# Patient Record
Sex: Female | Born: 1950 | Race: White | Hispanic: No | Marital: Married | Smoking: Never smoker
Health system: Southern US, Community
[De-identification: ages and names within clinical notes are randomized; demographics above are authoritative.]

## PROBLEM LIST (undated history)

## (undated) DIAGNOSIS — E039 Hypothyroidism, unspecified: Secondary | ICD-10-CM

## (undated) DIAGNOSIS — K743 Primary biliary cirrhosis: Secondary | ICD-10-CM

## (undated) DIAGNOSIS — R5383 Other fatigue: Secondary | ICD-10-CM

## (undated) DIAGNOSIS — R161 Splenomegaly, not elsewhere classified: Secondary | ICD-10-CM

## (undated) DIAGNOSIS — K7460 Unspecified cirrhosis of liver: Secondary | ICD-10-CM

## (undated) DIAGNOSIS — R188 Other ascites: Secondary | ICD-10-CM

## (undated) DIAGNOSIS — I10 Essential (primary) hypertension: Secondary | ICD-10-CM

## (undated) DIAGNOSIS — K219 Gastro-esophageal reflux disease without esophagitis: Secondary | ICD-10-CM

## (undated) HISTORY — PX: HERNIA REPAIR: SHX51

## (undated) HISTORY — PX: ABDOMINAL HYSTERECTOMY: SHX81

---

## 2005-06-04 ENCOUNTER — Ambulatory Visit: Payer: Self-pay | Admitting: Nurse Practitioner

## 2007-04-06 ENCOUNTER — Ambulatory Visit: Payer: Self-pay | Admitting: Family Medicine

## 2007-06-21 ENCOUNTER — Encounter: Payer: Self-pay | Admitting: Unknown Physician Specialty

## 2007-07-20 ENCOUNTER — Encounter: Payer: Self-pay | Admitting: Unknown Physician Specialty

## 2007-10-10 ENCOUNTER — Ambulatory Visit: Payer: Self-pay | Admitting: Family Medicine

## 2007-12-09 ENCOUNTER — Ambulatory Visit: Payer: Self-pay | Admitting: *Deleted

## 2008-01-17 ENCOUNTER — Ambulatory Visit: Payer: Self-pay | Admitting: Family Medicine

## 2008-01-23 ENCOUNTER — Encounter: Payer: Self-pay | Admitting: Family Medicine

## 2008-02-17 ENCOUNTER — Encounter: Payer: Self-pay | Admitting: Family Medicine

## 2008-03-19 ENCOUNTER — Encounter: Payer: Self-pay | Admitting: Family Medicine

## 2008-03-19 ENCOUNTER — Ambulatory Visit: Payer: Self-pay | Admitting: Internal Medicine

## 2008-03-30 ENCOUNTER — Ambulatory Visit: Payer: Self-pay | Admitting: Internal Medicine

## 2008-04-18 ENCOUNTER — Ambulatory Visit: Payer: Self-pay | Admitting: Internal Medicine

## 2008-11-08 ENCOUNTER — Ambulatory Visit: Payer: Self-pay

## 2009-11-14 ENCOUNTER — Ambulatory Visit: Payer: Self-pay

## 2011-10-31 ENCOUNTER — Ambulatory Visit: Payer: Self-pay

## 2011-11-27 ENCOUNTER — Emergency Department: Payer: Self-pay | Admitting: Internal Medicine

## 2011-11-27 LAB — CBC
HGB: 13 g/dL (ref 12.0–16.0)
MCH: 30.3 pg (ref 26.0–34.0)
MCV: 89 fL (ref 80–100)
Platelet: 231 10*3/uL (ref 150–440)
RBC: 4.29 10*6/uL (ref 3.80–5.20)
RDW: 12.3 % (ref 11.5–14.5)

## 2011-11-27 LAB — BASIC METABOLIC PANEL
Anion Gap: 7 (ref 7–16)
BUN: 17 mg/dL (ref 7–18)
Co2: 31 mmol/L (ref 21–32)
EGFR (African American): >60
EGFR (Non-African Amer.): >60
Glucose: 92 mg/dL (ref 65–99)
Osmolality: 281 (ref 275–301)
Potassium: 3.9 mmol/L (ref 3.5–5.1)
Sodium: 140 mmol/L (ref 136–145)

## 2011-11-27 LAB — HEPATIC FUNCTION PANEL A (ARMC)
Alkaline Phosphatase: 106 U/L (ref 50–136)
Bilirubin, Direct: 0.2 mg/dL (ref 0.00–0.20)
Bilirubin,Total: 0.6 mg/dL (ref 0.2–1.0)
SGOT(AST): 34 U/L (ref 15–37)
SGPT (ALT): 28 U/L

## 2011-11-27 LAB — CK TOTAL AND CKMB (NOT AT ARMC)
CK, Total: 29 U/L (ref 21–215)
CK-MB: <0.5 ng/mL — ABNORMAL LOW (ref 0.5–3.6)

## 2011-12-16 ENCOUNTER — Ambulatory Visit: Payer: Self-pay | Admitting: Gastroenterology

## 2012-01-01 ENCOUNTER — Ambulatory Visit: Payer: Self-pay | Admitting: Gastroenterology

## 2012-01-26 ENCOUNTER — Ambulatory Visit: Payer: Self-pay | Admitting: Gastroenterology

## 2012-02-08 ENCOUNTER — Other Ambulatory Visit: Payer: Self-pay | Admitting: Gastroenterology

## 2012-02-08 LAB — APTT: Activated PTT: 30 secs (ref 23.6–35.9)

## 2012-02-09 ENCOUNTER — Other Ambulatory Visit: Payer: Self-pay | Admitting: Diagnostic Radiology

## 2012-02-23 ENCOUNTER — Ambulatory Visit: Payer: Self-pay | Admitting: Family Medicine

## 2012-03-08 ENCOUNTER — Other Ambulatory Visit: Payer: Self-pay | Admitting: Gastroenterology

## 2012-03-08 LAB — PROTIME-INR
INR: 0.9
Prothrombin Time: 13 s

## 2012-03-08 LAB — PLATELET COUNT: Platelet: 112 x10 3/mm 3 — ABNORMAL LOW

## 2012-03-08 LAB — APTT: Activated PTT: 32.2 secs (ref 23.6–35.9)

## 2012-03-10 ENCOUNTER — Ambulatory Visit: Payer: Self-pay | Admitting: Gastroenterology

## 2012-03-17 LAB — PATHOLOGY REPORT

## 2012-07-01 ENCOUNTER — Ambulatory Visit: Payer: Self-pay | Admitting: Gastroenterology

## 2012-07-10 ENCOUNTER — Emergency Department: Payer: Self-pay | Admitting: Emergency Medicine

## 2012-07-10 LAB — BASIC METABOLIC PANEL
Anion Gap: 9 (ref 7–16)
BUN: 24 mg/dL — ABNORMAL HIGH (ref 7–18)
Calcium, Total: 9.4 mg/dL (ref 8.5–10.1)
Chloride: 106 mmol/L (ref 98–107)
Creatinine: 0.97 mg/dL (ref 0.60–1.30)
EGFR (African American): >60
Glucose: 95 mg/dL (ref 65–99)
Osmolality: 285 (ref 275–301)
Sodium: 141 mmol/L (ref 136–145)

## 2012-07-10 LAB — CBC
HCT: 45.9 % (ref 35.0–47.0)
HGB: 16 g/dL (ref 12.0–16.0)
MCH: 31.2 pg (ref 26.0–34.0)
MCHC: 34.9 g/dL (ref 32.0–36.0)
MCV: 90 fL (ref 80–100)
Platelet: 131 10*3/uL — ABNORMAL LOW (ref 150–440)
RBC: 5.13 10*6/uL (ref 3.80–5.20)

## 2012-07-12 ENCOUNTER — Other Ambulatory Visit: Payer: Self-pay | Admitting: Unknown Physician Specialty

## 2012-07-15 LAB — EAR CULTURE

## 2013-05-23 IMAGING — CT CT ABDOMEN AND PELVIS WITHOUT AND WITH CONTRAST
2 of 5 series · 13 of 32 positions shown, 18 images · non-contrast
Comparison: none

REASON FOR EXAM: lower abd pain abn US done at [HOSPITAL] showed Liver lesion
eval for fatty tissue ...
COMMENTS:

[Series 6: venous 3 · axial · portal-venous · 0.98mm/px · z∈[-1028,-662]mm · 7 of 164 slices shown, 12 images]
[im 21/164  soft-tissue]
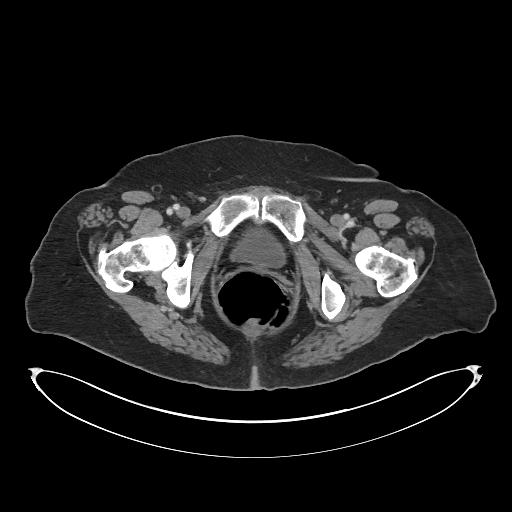
[im 21/164  bone]
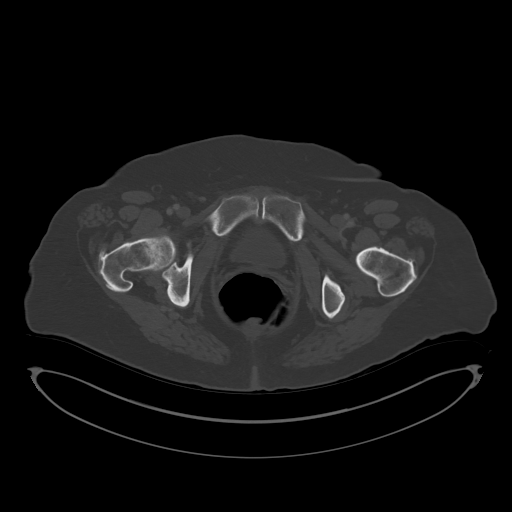
[im 41/164  soft-tissue]
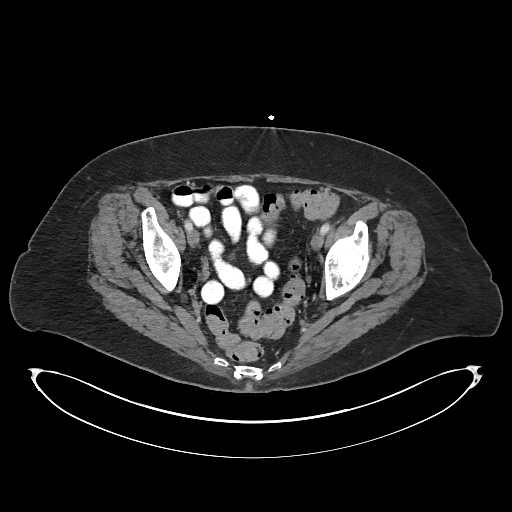
[im 62/164  soft-tissue]
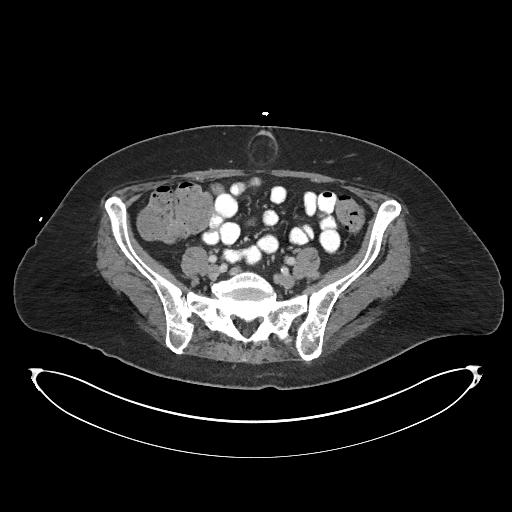
[im 82/164  soft-tissue]
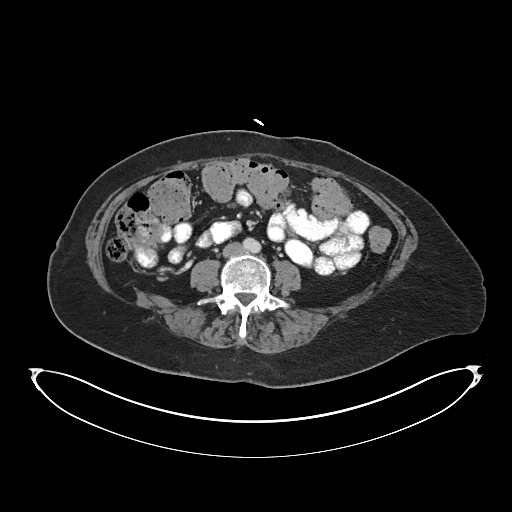
[im 82/164  lung]
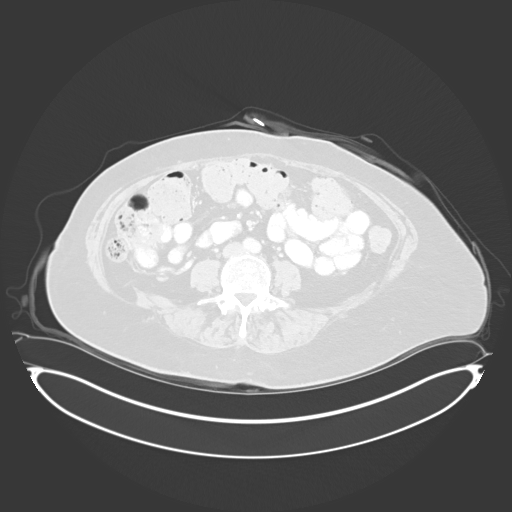
[im 102/164  soft-tissue]
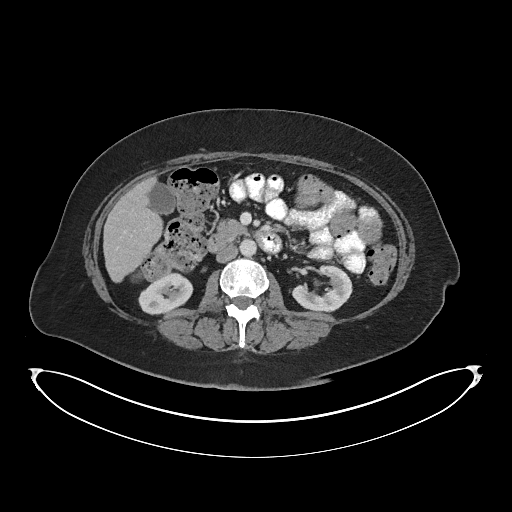
[im 102/164  lung]
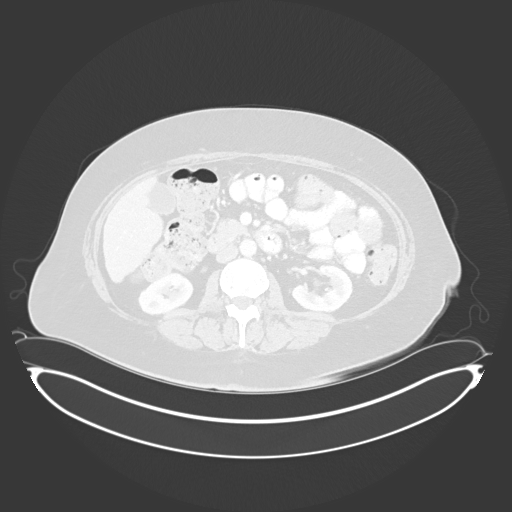
[im 123/164  soft-tissue]
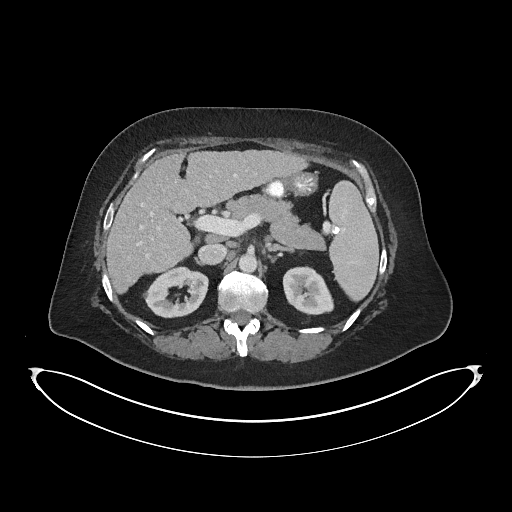
[im 123/164  lung]
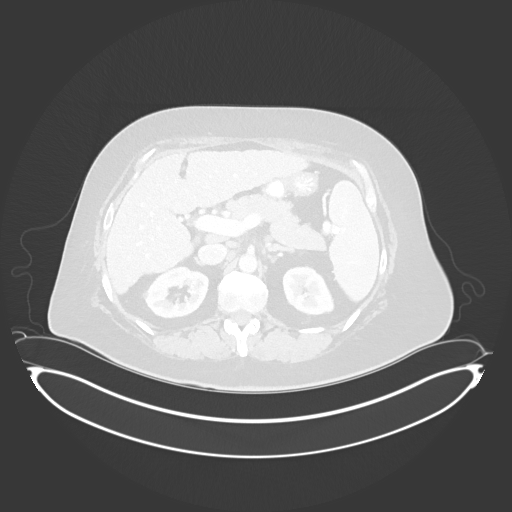
[im 143/164  soft-tissue]
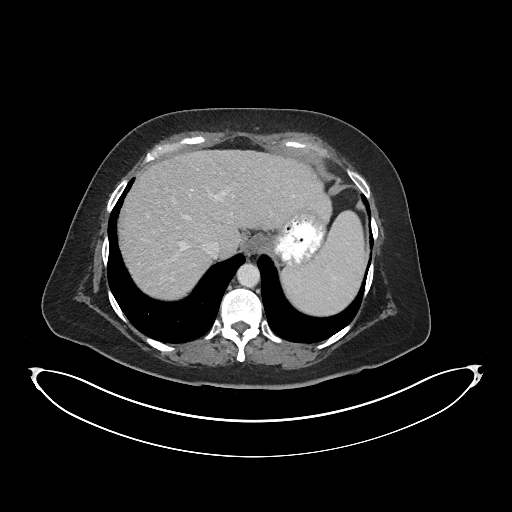
[im 143/164  lung]
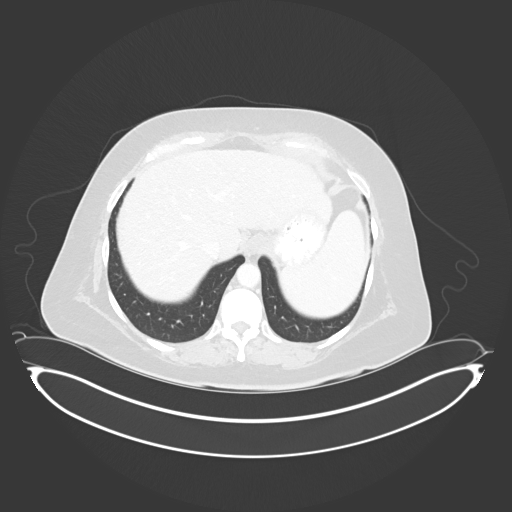

[Series 8: delay 3 · axial · delayed · 0.98mm/px · z∈[-1028,-722]mm · 6 of 164 slices shown]
[im 21/164  soft-tissue]
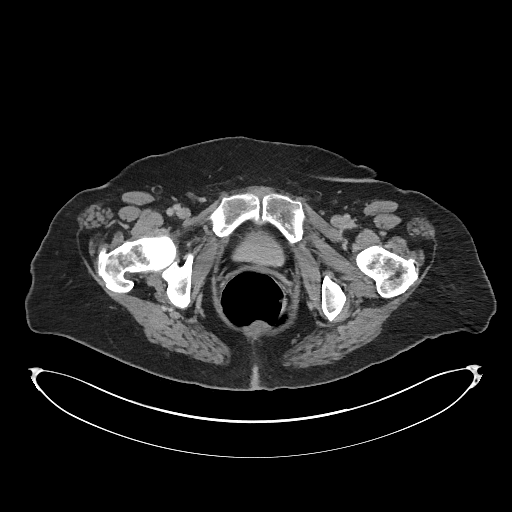
[im 41/164  soft-tissue]
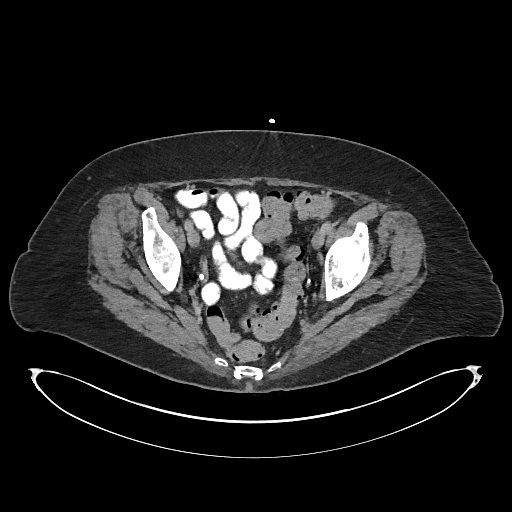
[im 62/164  soft-tissue]
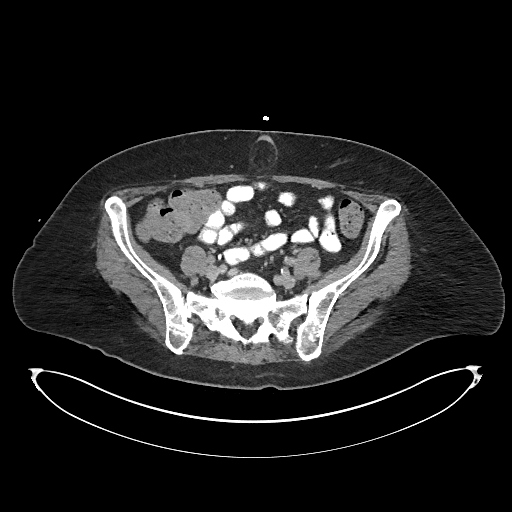
[im 82/164  soft-tissue]
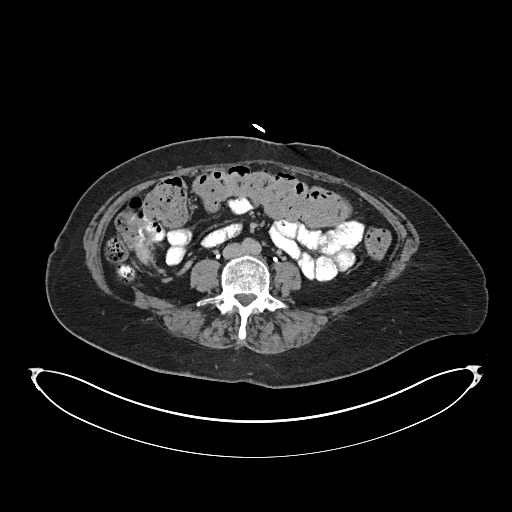
[im 102/164  soft-tissue]
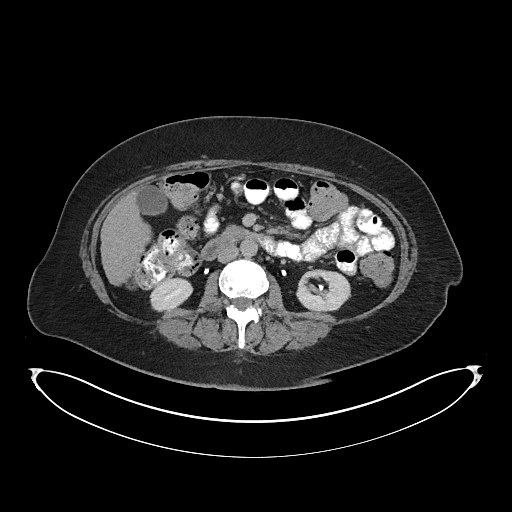
[im 123/164  soft-tissue]
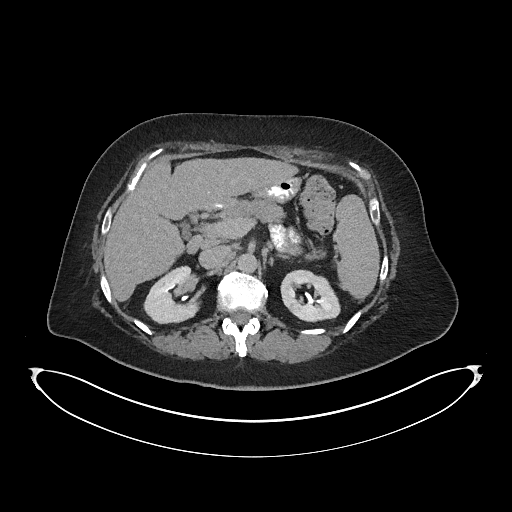

[13 of 32 positions shown; findings below may reference images not displayed]

PROCEDURE:     KCT - KCT ABDOMEN/PELVIS W/WO  - December 16, 2011  [DATE]

RESULT:     Triphasic CT of the abdomen and pelvis is performed with 100 mL
of Xsovue-UQQ iodinated intravenous contrast and oral contrast. Images are
reconstructed at 3.0 mm slice thickness in the axial plane. There is no
previous similar exam for comparison. The patient reportedly has a history
of abnormal ultrasound at another institution.

Precontrast images demonstrate punctate calcification within the left lobe
of the liver on image 41. The spleen shows borderline splenomegaly with a
superior to inferior dimension of 12.2 cm. The liver measures approximately
17.0 cm superior to inferior. Punctate calcification is seen along the hilar
region of the spleen on images 36 through 39 and could be vascular rather
than parenchymal in origin. There is a slightly heterogeneous attenuation
and enhancement pattern in the liver which could be secondary to fatty
infiltration. Arterial and venous phase images show no discrete hepatic
mass. Delayed postcontrast images show no focal mass. There is no
intrahepatic or extrahepatic biliary ductal dilation evident. Both kidneys
excrete contrast opacified urine into extrarenal collecting systems without
obstructive change. Tiny layering stones or minimal gallbladder sludge may
be present in the area of image 58 of the precontrast images. The adrenal
glands appear normal. There is no abnormal bowel distention or wall
thickening evident. There is a fat-filled umbilical hernia. There is no
ascites or abnormal fluid collection. Oral contrast has not reached the
colon at the time of imaging. There is a moderate amount of retained fecal
material present. The lung bases appear clear. Bony structures show normal
alignment of the spine with no focal pelvic mass or abnormality. The hips
appear within normal limits. No adenopathy is evident.
IMPRESSION: 1.     Borderline hepatosplenomegaly.
2.     Punctate calcification within the left lobe of the liver without an
associated parenchymal mass by CT. The liver shows a somewhat heterogeneous
pattern of attenuation which can be consistent with some fatty infiltration.
A circumscribed mass is not seen in the liver or spleen. Calcification in
the splenic hilar region appears to more likely be vascular than
parenchymal. The previous outside abnormal images are not available to give
specific attention to the area of concern from the previous ultrasound.

## 2013-06-28 ENCOUNTER — Ambulatory Visit: Payer: Self-pay | Admitting: Gastroenterology

## 2014-10-02 ENCOUNTER — Ambulatory Visit: Payer: Self-pay | Admitting: Family Medicine

## 2015-05-24 ENCOUNTER — Other Ambulatory Visit: Payer: Self-pay | Admitting: Surgery

## 2015-05-24 DIAGNOSIS — K743 Primary biliary cirrhosis: Secondary | ICD-10-CM

## 2015-05-28 ENCOUNTER — Ambulatory Visit
Admission: RE | Admit: 2015-05-28 | Discharge: 2015-05-28 | Disposition: A | Discharge status: Home / Self Care | Payer: Medicare HMO | Source: Ambulatory Visit | Attending: Surgery | Admitting: Surgery

## 2015-05-28 DIAGNOSIS — R161 Splenomegaly, not elsewhere classified: Secondary | ICD-10-CM | POA: Diagnosis not present

## 2015-05-28 DIAGNOSIS — K743 Primary biliary cirrhosis: Secondary | ICD-10-CM | POA: Diagnosis not present

## 2015-05-29 ENCOUNTER — Encounter
Admission: RE | Admit: 2015-05-29 | Discharge: 2015-05-29 | Disposition: A | Discharge status: Home / Self Care | Payer: Medicare HMO | Source: Ambulatory Visit | Attending: Surgery | Admitting: Surgery

## 2015-05-29 DIAGNOSIS — Z01812 Encounter for preprocedural laboratory examination: Secondary | ICD-10-CM | POA: Diagnosis not present

## 2015-05-29 DIAGNOSIS — Z01810 Encounter for preprocedural cardiovascular examination: Secondary | ICD-10-CM | POA: Insufficient documentation

## 2015-05-29 HISTORY — DX: Gastro-esophageal reflux disease without esophagitis: K21.9

## 2015-05-29 HISTORY — DX: Hypothyroidism, unspecified: E03.9

## 2015-05-29 HISTORY — DX: Essential (primary) hypertension: I10

## 2015-05-29 HISTORY — DX: Primary biliary cirrhosis: K74.3

## 2015-05-29 LAB — PROTIME-INR
INR: 0.98
PROTHROMBIN TIME: 13.2 s (ref 11.4–15.0)

## 2015-05-29 NOTE — Patient Instructions (Signed)
  Your procedure is scheduled on: Tuesday 06/04/2015  Report to Day Surgery. 2nd [place] To find out your arrival time please call [phone] between 1PM - 3PM on Monday 06/03/2015.  Remember: Instructions that are not followed completely may result in serious medical risk, up to and including death, or upon the discretion of your surgeon and anesthesiologist your surgery may need to be rescheduled.    __X__ 1. Do not eat food or drink liquids after midnight. No gum chewing or hard candies.     __X__ 2. No Alcohol for 24 hours before or after surgery.   ____ 3. Bring all medications with you on the day of surgery if instructed.    __X__ 4. Notify your doctor if there is any change in your medical condition     (cold, fever, infections).     Do not wear jewelry, make-up, hairpins, clips or nail polish.  Do not wear lotions, powders, or perfumes.   Do not shave 48 hours prior to surgery. Men may shave face and neck.  Do not bring valuables to the hospital.    [facility] is not responsible for any belongings or valuables.               Contacts, dentures or bridgework may not be worn into surgery.  Leave your suitcase in the car. After surgery it may be brought to your room.  For patients admitted to the hospital, discharge time is determined by your                treatment team.   Patients discharged the day of surgery will not be allowed to drive home.   Please read over the following fact sheets that you were given:   Surgical Site Infection Prevention   __X__ Take these medicines the morning of surgery with A SIP OF WATER:    1. AMLODIPINE  2. CARVEDILOL  3. LEVOTHYROXINE  4. PANTOPRAZOLE  5. QUINIPRIL  6.  ____ Fleet Enema (as directed)   __X__ Use CHG Soap as directed  ____ Use inhalers on the day of surgery  ____ Stop metformin 2 days prior to surgery    ____ Take 1/2 of usual insulin dose the night before surgery and none on the morning of  surgery.   __X__ Stop Coumadin/Plavix/aspirin  ALREADY STOPPED  ____ Stop Anti-inflammatories on    __X__ Stop supplements until after surgery.  STARTING TODAY THE VITAMIN T55 AND GARLIC  ____ Bring C-Pap to the hospital.

## 2015-06-04 ENCOUNTER — Encounter
Admission: RE | Disposition: A | Discharge status: Home / Self Care | Payer: Self-pay | Source: Ambulatory Visit | Attending: Surgery

## 2015-06-04 ENCOUNTER — Ambulatory Visit: Payer: Medicare HMO | Admitting: Anesthesiology

## 2015-06-04 ENCOUNTER — Encounter: Payer: Self-pay | Admitting: *Deleted

## 2015-06-04 ENCOUNTER — Ambulatory Visit
Admission: RE | Admit: 2015-06-04 | Discharge: 2015-06-04 | Disposition: A | Discharge status: Home / Self Care | Payer: Medicare HMO | Source: Ambulatory Visit | Attending: Surgery | Admitting: Surgery

## 2015-06-04 DIAGNOSIS — E079 Disorder of thyroid, unspecified: Secondary | ICD-10-CM | POA: Insufficient documentation

## 2015-06-04 DIAGNOSIS — Z79899 Other long term (current) drug therapy: Secondary | ICD-10-CM | POA: Diagnosis not present

## 2015-06-04 DIAGNOSIS — K219 Gastro-esophageal reflux disease without esophagitis: Secondary | ICD-10-CM | POA: Insufficient documentation

## 2015-06-04 DIAGNOSIS — K439 Ventral hernia without obstruction or gangrene: Secondary | ICD-10-CM | POA: Diagnosis present

## 2015-06-04 DIAGNOSIS — I10 Essential (primary) hypertension: Secondary | ICD-10-CM | POA: Insufficient documentation

## 2015-06-04 HISTORY — PX: VENTRAL HERNIA REPAIR: SHX424

## 2015-06-04 SURGERY — REPAIR, HERNIA, VENTRAL
Anesthesia: General | Wound class: Clean

## 2015-06-04 MED ORDER — BUPIVACAINE-EPINEPHRINE (PF) 0.5% -1:200000 IJ SOLN
INTRAMUSCULAR | Status: AC
  Filled 2015-06-04: qty 30

## 2015-06-04 MED ORDER — LIDOCAINE HCL (CARDIAC) 20 MG/ML IV SOLN
INTRAVENOUS | Status: DC | PRN
  Administered 2015-06-04: 100 mg via INTRAVENOUS

## 2015-06-04 MED ORDER — EPHEDRINE SULFATE 50 MG/ML IJ SOLN
INTRAMUSCULAR | Status: DC | PRN
  Administered 2015-06-04: 5 mg via INTRAVENOUS
  Administered 2015-06-04: 10 mg via INTRAVENOUS

## 2015-06-04 MED ORDER — GLYCOPYRROLATE 0.2 MG/ML IJ SOLN
INTRAMUSCULAR | Status: DC | PRN
  Administered 2015-06-04: .4 mg via INTRAVENOUS
  Administered 2015-06-04: 0.3 mg via INTRAVENOUS

## 2015-06-04 MED ORDER — ONDANSETRON HCL 4 MG/2ML IJ SOLN: 4.0000 mg | Freq: Once | INTRAMUSCULAR | Status: DC | PRN

## 2015-06-04 MED ORDER — FENTANYL CITRATE (PF) 100 MCG/2ML IJ SOLN
INTRAMUSCULAR | Status: AC
  Administered 2015-06-04: 25 ug via INTRAVENOUS
  Filled 2015-06-04: qty 2

## 2015-06-04 MED ORDER — DEXAMETHASONE SODIUM PHOSPHATE 4 MG/ML IJ SOLN
INTRAMUSCULAR | Status: DC | PRN
  Administered 2015-06-04: 5 mg via INTRAVENOUS

## 2015-06-04 MED ORDER — ROCURONIUM BROMIDE 100 MG/10ML IV SOLN
INTRAVENOUS | Status: DC | PRN
  Administered 2015-06-04: 50 mg via INTRAVENOUS

## 2015-06-04 MED ORDER — FENTANYL CITRATE (PF) 100 MCG/2ML IJ SOLN
25.0000 ug | INTRAMUSCULAR | Status: DC | PRN
  Administered 2015-06-04 (×4): 25 ug via INTRAVENOUS

## 2015-06-04 MED ORDER — HYDROCODONE-ACETAMINOPHEN 5-325 MG PO TABS: 1.0000 | ORAL_TABLET | ORAL | Status: DC | PRN

## 2015-06-04 MED ORDER — FENTANYL CITRATE (PF) 100 MCG/2ML IJ SOLN
INTRAMUSCULAR | Status: DC | PRN
  Administered 2015-06-04 (×2): 100 ug via INTRAVENOUS

## 2015-06-04 MED ORDER — LACTATED RINGERS IV SOLN
INTRAVENOUS | Status: DC
  Administered 2015-06-04: 09:00:00 via INTRAVENOUS

## 2015-06-04 MED ORDER — MIDAZOLAM HCL 2 MG/2ML IJ SOLN
INTRAMUSCULAR | Status: DC | PRN
  Administered 2015-06-04: 2 mg via INTRAVENOUS

## 2015-06-04 MED ORDER — BUPIVACAINE-EPINEPHRINE 0.5% -1:200000 IJ SOLN
INTRAMUSCULAR | Status: DC | PRN
  Administered 2015-06-04: 20 mL

## 2015-06-04 MED ORDER — NEOSTIGMINE METHYLSULFATE 10 MG/10ML IV SOLN
INTRAVENOUS | Status: DC | PRN
  Administered 2015-06-04: 3 mg via INTRAVENOUS

## 2015-06-04 MED ORDER — DEXTROSE 5 % IV SOLN
3.0000 g | Freq: Once | INTRAVENOUS | Status: AC
  Administered 2015-06-04: 3 g via INTRAVENOUS
  Filled 2015-06-04: qty 3000

## 2015-06-04 MED ORDER — ONDANSETRON HCL 4 MG/2ML IJ SOLN
INTRAMUSCULAR | Status: DC | PRN
  Administered 2015-06-04: 4 mg via INTRAVENOUS

## 2015-06-04 MED ORDER — PROPOFOL 10 MG/ML IV BOLUS
INTRAVENOUS | Status: DC | PRN
  Administered 2015-06-04: 200 mg via INTRAVENOUS

## 2015-06-04 SURGICAL SUPPLY — 23 items
CANISTER SUCT 1200ML W/VALVE (MISCELLANEOUS) ×3 IMPLANT
CHLORAPREP W/TINT 26ML (MISCELLANEOUS) ×3 IMPLANT
DRAPE LAPAROTOMY 100X77 ABD (DRAPES) ×3 IMPLANT
GAUZE SPONGE 4X4 12PLY STRL (GAUZE/BANDAGES/DRESSINGS) IMPLANT
GLOVE BIO SURGEON STRL SZ7.5 (GLOVE) ×15 IMPLANT
GOWN STRL REUS W/ TWL LRG LVL3 (GOWN DISPOSABLE) ×3 IMPLANT
GOWN STRL REUS W/TWL LRG LVL3 (GOWN DISPOSABLE) ×6
KIT RM TURNOVER STRD PROC AR (KITS) ×3 IMPLANT
LABEL OR SOLS (LABEL) ×3 IMPLANT
LIQUID BAND (GAUZE/BANDAGES/DRESSINGS) ×3 IMPLANT
MESH SYNTHETIC 4X6 SOFT BARD (Mesh General) ×1 IMPLANT
MESH SYNTHETIC SOFT BARD 4X6 (Mesh General) ×2 IMPLANT
NDL SAFETY 25GX1.5 (NEEDLE) ×3 IMPLANT
NS IRRIG 500ML POUR BTL (IV SOLUTION) ×3 IMPLANT
PACK BASIN MINOR ARMC (MISCELLANEOUS) ×3 IMPLANT
PAD GROUND ADULT SPLIT (MISCELLANEOUS) ×3 IMPLANT
STAPLER SKIN PROX 35W (STAPLE) IMPLANT
SUT CHROMIC 3 0 SH 27 (SUTURE) ×3 IMPLANT
SUT MNCRL 4-0 (SUTURE) ×2
SUT MNCRL 4-0 27XMFL (SUTURE) ×1
SUT SURGILON 0 30 BLK (SUTURE) ×6 IMPLANT
SUTURE MNCRL 4-0 27XMF (SUTURE) ×1 IMPLANT
SYRINGE 10CC LL (SYRINGE) ×3 IMPLANT

## 2015-06-04 NOTE — Transfer of Care (Signed)
Immediate Anesthesia Transfer of Care Note  Patient: Kristine Fischer  Procedure(s) Performed: Procedure(s): HERNIA REPAIR VENTRAL ADULT (N/A)  Patient Location: PACU  Anesthesia Type:General  Level of Consciousness: sedated  Airway & Oxygen Therapy: Patient Spontanous Breathing and Patient connected to face mask oxygen  Post-op Assessment: Report given to RN and Post -op Vital signs reviewed and stable  Post vital signs: Reviewed and stable  Last Vitals:  Filed Vitals:   06/04/15 1219  BP: 129/78  Pulse: 55  Temp:   Resp: 11  Temp 50.5  Complications: No apparent anesthesia complications

## 2015-06-04 NOTE — H&P (Signed)
  She reports no change in condition since the day of the office visit.  H&P was reviewed.  Platelet count slightly low at  131,000, hemoglobin 16.0, pro time 13.2 seconds, creatinine 0.97  I discussed the plan for ventral hernia repair.

## 2015-06-04 NOTE — Anesthesia Procedure Notes (Signed)
Procedure Name: Intubation Date/Time: 06/04/2015 10:56 AM Performed by: Rosaria Ferries, Aviela Blundell Pre-anesthesia Checklist: Patient identified, Emergency Drugs available, Suction available and Patient being monitored Patient Re-evaluated:Patient Re-evaluated prior to inductionOxygen Delivery Method: Circle system utilized Preoxygenation: Pre-oxygenation with 100% oxygen Intubation Type: IV induction Laryngoscope Size: Mac and 3 Grade View: Grade I Tube type: Oral Tube size: 7.0 mm Number of attempts: 1 Placement Confirmation: ETT inserted through vocal cords under direct vision,  positive ETCO2 and breath sounds checked- equal and bilateral Secured at: 21 cm Tube secured with: Tape Dental Injury: Teeth and Oropharynx as per pre-operative assessment

## 2015-06-04 NOTE — Anesthesia Preprocedure Evaluation (Signed)
Anesthesia Evaluation  Patient identified by MRN, date of birth, ID band Patient awake    Reviewed: Allergy & Precautions, NPO status , Patient's Chart, lab work & pertinent test results  History of Anesthesia Complications Negative for: history of anesthetic complications  Airway Mallampati: II  TM Distance: >3 FB Neck ROM: Full    Dental  (+) Teeth Intact   Pulmonary          Cardiovascular hypertension, Pt. on medications and Pt. on home beta blockers     Neuro/Psych    GI/Hepatic GERD-  Medicated and Controlled,(+) Cirrhosis - (primary biliary )      ,   Endo/Other  Hypothyroidism Morbid obesity  Renal/GU      Musculoskeletal   Abdominal   Peds  Hematology   Anesthesia Other Findings   Reproductive/Obstetrics                             Anesthesia Physical Anesthesia Plan  ASA: III  Anesthesia Plan: General   Post-op Pain Management:    Induction: Intravenous  Airway Management Planned: Oral ETT  Additional Equipment:   Intra-op Plan:   Post-operative Plan:   Informed Consent: I have reviewed the patients History and Physical, chart, labs and discussed the procedure including the risks, benefits and alternatives for the proposed anesthesia with the patient or authorized representative who has indicated his/her understanding and acceptance.     Plan Discussed with:   Anesthesia Plan Comments:         Anesthesia Quick Evaluation

## 2015-06-04 NOTE — Op Note (Signed)
OPERATIVE REPORT  PREOPERATIVE  DIAGNOSIS: . Ventral hernia  POSTOPERATIVE DIAGNOSIS: . Ventral hernia  PROCEDURE: . Ventral hernia repair  ANESTHESIA:  General  SURGEON: Rochel Brome  MD   INDICATIONS: . She reports bulging and moderate discomfort in the epigastrium. Ventral hernia was demonstrated on physical exam. The palpable mass was approximately 8 cm in dimension with the patient standing. Repair was recommended for definitive treatment  With the patient on the operating table in the supine position under general endotracheal anesthesia the abdomen was prepared with ChloraPrep solution and draped in a sterile manner. An upper abdominal midline incision was made approximally 7 cm in length with the lower end of the incision just above the umbilicus. The incision was carried down through subcutaneous tissues. A number of small bleeding points were cauterized. There was a ventral hernia sac which was dissected free from surrounding structures and did have a smooth plane of dissection. This was dissected down to the fascial ring defect. The sac was dissected free from the fascial ring defect. The hernia was chronically incarcerated. It was necessary to enlarge the fascial ring defect on the left side by approximately 8 mm to allow reduction of the fatty mass. Properitoneal fat was dissected away from the fascia circumferentially.Bard soft mesh was selected and cut to create an oval shape of 2.5 x 3.5 cm. This was placed into the properitoneal plane and sutured to the overlying fascia with 0 Surgilon through and through sutures. Next the fascial ring defect was closed with a transversely oriented suture line of interrupted 0 Surgilon figure-of-eight sutures incorporating mesh into the repair. Subcutaneous tissues and deep fascia were infiltrated with half percent Sensorcaine with epinephrine. Subcutaneous  tissues were approximated with 3-0 chromic. The skin was closed with interrupted and running  4-0 Monocryl subcuticular suture and LiquiBand.  The patient tolerated the procedure satisfactorily and was then prepared for transfer to the recovery room  [facility].D.

## 2015-06-04 NOTE — Anesthesia Postprocedure Evaluation (Signed)
  Anesthesia Post-op Note  Patient: Kristine Fischer  Procedure(s) Performed: Procedure(s): HERNIA REPAIR VENTRAL ADULT (N/A)  Anesthesia type:General  Patient location: PACU  Post pain: Pain level controlled  Post assessment: Post-op Vital signs reviewed, Patient's Cardiovascular Status Stable, Respiratory Function Stable, Patent Airway and No signs of Nausea or vomiting  Post vital signs: Reviewed and stable  Last Vitals:  Filed Vitals:   06/04/15 1219  BP: 129/78  Pulse: 55  Temp: 36.6 C  Resp: 11    Level of consciousness: awake, alert  and patient cooperative  Complications: No apparent anesthesia complications

## 2015-06-04 NOTE — Discharge Instructions (Signed)
AMBULATORY SURGERY  DISCHARGE INSTRUCTIONS   1) The drugs that you were given will stay in your system until tomorrow so for the next 24 hours you should not:  A) Drive an automobile B) Make any legal decisions C) Drink any alcoholic beverage   2) You may resume regular meals tomorrow.  Today it is better to start with liquids and gradually work up to solid foods.  You may eat anything you prefer, but it is better to start with liquids, then soup and crackers, and gradually work up to solid foods.   3) Please notify your doctor immediately if you have any unusual bleeding, trouble breathing, redness and pain at the surgery site, drainage, fever, or pain not relieved by medication.    4) Additional Instructions:    Take Tylenol or Norco if needed for pain. Resume aspirin on Thursday Avoid straining and heavy lifting May shower    Please contact your physician with any problems or Same Day Surgery at [phone], Monday through Friday 6 am to 4 pm, or [place] at [facility] number at [phone].

## 2017-01-15 ENCOUNTER — Inpatient Hospital Stay
Admission: EM | Admit: 2017-01-15 | Discharge: 2017-01-17 | DRG: 202 | Disposition: A | Discharge status: Home / Self Care | Payer: Medicare HMO | Attending: Internal Medicine | Admitting: Internal Medicine

## 2017-01-15 ENCOUNTER — Encounter: Payer: Self-pay | Admitting: Internal Medicine

## 2017-01-15 ENCOUNTER — Emergency Department: Payer: Medicare HMO

## 2017-01-15 DIAGNOSIS — I110 Hypertensive heart disease with heart failure: Secondary | ICD-10-CM | POA: Diagnosis present

## 2017-01-15 DIAGNOSIS — K219 Gastro-esophageal reflux disease without esophagitis: Secondary | ICD-10-CM | POA: Diagnosis present

## 2017-01-15 DIAGNOSIS — K743 Primary biliary cirrhosis: Secondary | ICD-10-CM | POA: Diagnosis present

## 2017-01-15 DIAGNOSIS — J209 Acute bronchitis, unspecified: Principal | ICD-10-CM | POA: Diagnosis present

## 2017-01-15 DIAGNOSIS — I5033 Acute on chronic diastolic (congestive) heart failure: Secondary | ICD-10-CM | POA: Diagnosis present

## 2017-01-15 DIAGNOSIS — J9601 Acute respiratory failure with hypoxia: Secondary | ICD-10-CM | POA: Diagnosis present

## 2017-01-15 DIAGNOSIS — R7989 Other specified abnormal findings of blood chemistry: Secondary | ICD-10-CM | POA: Diagnosis present

## 2017-01-15 DIAGNOSIS — Z79899 Other long term (current) drug therapy: Secondary | ICD-10-CM | POA: Diagnosis not present

## 2017-01-15 DIAGNOSIS — R0902 Hypoxemia: Secondary | ICD-10-CM

## 2017-01-15 DIAGNOSIS — Z7982 Long term (current) use of aspirin: Secondary | ICD-10-CM | POA: Diagnosis not present

## 2017-01-15 DIAGNOSIS — E039 Hypothyroidism, unspecified: Secondary | ICD-10-CM | POA: Diagnosis present

## 2017-01-15 LAB — INFLUENZA PANEL BY PCR (TYPE A & B)
INFLBPCR: NEGATIVE
Influenza A By PCR: NEGATIVE

## 2017-01-15 LAB — BRAIN NATRIURETIC PEPTIDE: B NATRIURETIC PEPTIDE 5: 136 pg/mL — AB (ref 0.0–100.0)

## 2017-01-15 MED ORDER — VITAMIN B-12 1000 MCG PO TABS
1000.0000 ug | ORAL_TABLET | Freq: Every day | ORAL | Status: DC
  Administered 2017-01-15 – 2017-01-17 (×3): 1000 ug via ORAL
  Filled 2017-01-15 (×3): qty 1

## 2017-01-15 MED ORDER — SODIUM CHLORIDE 0.9% FLUSH: 3.0000 mL | INTRAVENOUS | Status: DC | PRN

## 2017-01-15 MED ORDER — ALBUTEROL SULFATE (2.5 MG/3ML) 0.083% IN NEBU
2.5000 mg | INHALATION_SOLUTION | Freq: Once | RESPIRATORY_TRACT | Status: AC
  Administered 2017-01-15: 2.5 mg via RESPIRATORY_TRACT
  Filled 2017-01-15: qty 3

## 2017-01-15 MED ORDER — ALBUTEROL SULFATE (2.5 MG/3ML) 0.083% IN NEBU
5.0000 mg | INHALATION_SOLUTION | Freq: Once | RESPIRATORY_TRACT | Status: AC
  Administered 2017-01-15: 5 mg via RESPIRATORY_TRACT
  Filled 2017-01-15: qty 6

## 2017-01-15 MED ORDER — METHYLPREDNISOLONE SODIUM SUCC 125 MG IJ SOLR
125.0000 mg | Freq: Once | INTRAMUSCULAR | Status: AC
  Administered 2017-01-15: 125 mg via INTRAVENOUS
  Filled 2017-01-15: qty 2

## 2017-01-15 MED ORDER — ALBUTEROL SULFATE HFA 108 (90 BASE) MCG/ACT IN AERS: 2.0000 | INHALATION_SPRAY | RESPIRATORY_TRACT | 0 refills | Status: DC | PRN

## 2017-01-15 MED ORDER — PREDNISONE 20 MG PO TABS
60.0000 mg | ORAL_TABLET | Freq: Once | ORAL | Status: DC
  Filled 2017-01-15: qty 3

## 2017-01-15 MED ORDER — URSODIOL 300 MG PO CAPS
300.0000 mg | ORAL_CAPSULE | Freq: Every day | ORAL | Status: DC
  Administered 2017-01-15 – 2017-01-17 (×3): 300 mg via ORAL
  Filled 2017-01-15 (×3): qty 1

## 2017-01-15 MED ORDER — HYDROCHLOROTHIAZIDE 25 MG PO TABS
25.0000 mg | ORAL_TABLET | Freq: Every day | ORAL | Status: DC
  Administered 2017-01-15 – 2017-01-17 (×3): 25 mg via ORAL
  Filled 2017-01-15 (×3): qty 1

## 2017-01-15 MED ORDER — ACETAMINOPHEN 650 MG RE SUPP: 650.0000 mg | Freq: Four times a day (QID) | RECTAL | Status: DC | PRN

## 2017-01-15 MED ORDER — CARVEDILOL 6.25 MG PO TABS
12.5000 mg | ORAL_TABLET | Freq: Two times a day (BID) | ORAL | Status: DC
  Administered 2017-01-15: 12.5 mg via ORAL
  Filled 2017-01-15 (×2): qty 2

## 2017-01-15 MED ORDER — FUROSEMIDE 10 MG/ML IJ SOLN
40.0000 mg | Freq: Once | INTRAMUSCULAR | Status: AC
  Administered 2017-01-15: 40 mg via INTRAVENOUS
  Filled 2017-01-15: qty 4

## 2017-01-15 MED ORDER — VITAMIN D 1000 UNITS PO TABS
1000.0000 [IU] | ORAL_TABLET | Freq: Every day | ORAL | Status: DC
  Administered 2017-01-15 – 2017-01-17 (×3): 1000 [IU] via ORAL
  Filled 2017-01-15 (×3): qty 1

## 2017-01-15 MED ORDER — ONDANSETRON HCL 4 MG PO TABS: 4.0000 mg | ORAL_TABLET | Freq: Four times a day (QID) | ORAL | Status: DC | PRN

## 2017-01-15 MED ORDER — ONDANSETRON HCL 4 MG/2ML IJ SOLN: 4.0000 mg | Freq: Four times a day (QID) | INTRAMUSCULAR | Status: DC | PRN

## 2017-01-15 MED ORDER — METHYLPREDNISOLONE SODIUM SUCC 125 MG IJ SOLR
60.0000 mg | Freq: Two times a day (BID) | INTRAMUSCULAR | Status: DC
  Administered 2017-01-15 – 2017-01-17 (×4): 60 mg via INTRAVENOUS
  Filled 2017-01-15 (×4): qty 2

## 2017-01-15 MED ORDER — ACETAMINOPHEN 325 MG PO TABS: 650.0000 mg | ORAL_TABLET | Freq: Four times a day (QID) | ORAL | Status: DC | PRN

## 2017-01-15 MED ORDER — POTASSIUM CHLORIDE CRYS ER 20 MEQ PO TBCR
40.0000 meq | EXTENDED_RELEASE_TABLET | Freq: Once | ORAL | Status: AC
  Administered 2017-01-15: 13:00:00 40 meq via ORAL
  Filled 2017-01-15: qty 2

## 2017-01-15 MED ORDER — HYDROCOD POLST-CPM POLST ER 10-8 MG/5ML PO SUER: 5.0000 mL | Freq: Two times a day (BID) | ORAL | 0 refills | Status: DC | PRN

## 2017-01-15 MED ORDER — ALBUTEROL SULFATE (2.5 MG/3ML) 0.083% IN NEBU: 2.5000 mg | INHALATION_SOLUTION | RESPIRATORY_TRACT | Status: DC | PRN

## 2017-01-15 MED ORDER — SODIUM CHLORIDE 0.9 % IV SOLN: 250.0000 mL | INTRAVENOUS | Status: DC | PRN

## 2017-01-15 MED ORDER — AZITHROMYCIN 250 MG PO TABS: ORAL_TABLET | ORAL | 0 refills | Status: DC

## 2017-01-15 MED ORDER — PREDNISONE 20 MG PO TABS: 60.0000 mg | ORAL_TABLET | Freq: Every day | ORAL | 0 refills | Status: DC

## 2017-01-15 MED ORDER — LEVOTHYROXINE SODIUM 25 MCG PO TABS
150.0000 ug | ORAL_TABLET | ORAL | Status: DC
  Administered 2017-01-15 – 2017-01-17 (×3): 150 ug via ORAL
  Filled 2017-01-15 (×3): qty 1

## 2017-01-15 MED ORDER — AZITHROMYCIN 500 MG PO TABS
500.0000 mg | ORAL_TABLET | Freq: Once | ORAL | Status: AC
  Administered 2017-01-15: 500 mg via ORAL
  Filled 2017-01-15: qty 1

## 2017-01-15 MED ORDER — ALBUTEROL (5 MG/ML) CONTINUOUS INHALATION SOLN: 5.0000 mg | INHALATION_SOLUTION | Freq: Once | RESPIRATORY_TRACT | Status: DC

## 2017-01-15 MED ORDER — ENOXAPARIN SODIUM 40 MG/0.4ML ~~LOC~~ SOLN
40.0000 mg | SUBCUTANEOUS | Status: DC
  Administered 2017-01-15 – 2017-01-16 (×2): 40 mg via SUBCUTANEOUS
  Filled 2017-01-15 (×2): qty 0.4

## 2017-01-15 MED ORDER — HYDROCOD POLST-CPM POLST ER 10-8 MG/5ML PO SUER
5.0000 mL | Freq: Two times a day (BID) | ORAL | Status: DC | PRN
  Administered 2017-01-15 – 2017-01-16 (×2): 5 mL via ORAL
  Filled 2017-01-15 (×2): qty 5

## 2017-01-15 MED ORDER — METHYLPREDNISOLONE SODIUM SUCC 125 MG IJ SOLR: 125.0000 mg | Freq: Once | INTRAMUSCULAR | Status: DC

## 2017-01-15 MED ORDER — ASPIRIN 81 MG PO CHEW
81.0000 mg | CHEWABLE_TABLET | Freq: Every day | ORAL | Status: DC
  Administered 2017-01-15 – 2017-01-17 (×3): 81 mg via ORAL
  Filled 2017-01-15 (×2): qty 1

## 2017-01-15 MED ORDER — HYDROCOD POLST-CPM POLST ER 10-8 MG/5ML PO SUER
5.0000 mL | Freq: Once | ORAL | Status: AC
  Administered 2017-01-15: 5 mL via ORAL
  Filled 2017-01-15: qty 5

## 2017-01-15 MED ORDER — PANTOPRAZOLE SODIUM 40 MG PO TBEC
40.0000 mg | DELAYED_RELEASE_TABLET | Freq: Every day | ORAL | Status: DC
  Administered 2017-01-16 – 2017-01-17 (×2): 40 mg via ORAL
  Filled 2017-01-15 (×2): qty 1

## 2017-01-15 MED ORDER — POLYETHYLENE GLYCOL 3350 17 G PO PACK
17.0000 g | PACK | Freq: Every day | ORAL | Status: DC | PRN
  Administered 2017-01-17: 17 g via ORAL
  Filled 2017-01-15: qty 1

## 2017-01-15 MED ORDER — SODIUM CHLORIDE 0.9% FLUSH
3.0000 mL | Freq: Two times a day (BID) | INTRAVENOUS | Status: DC
  Administered 2017-01-15 – 2017-01-17 (×5): 3 mL via INTRAVENOUS

## 2017-01-15 MED ORDER — LISINOPRIL 20 MG PO TABS
40.0000 mg | ORAL_TABLET | Freq: Every day | ORAL | Status: DC
  Administered 2017-01-15 – 2017-01-17 (×3): 40 mg via ORAL
  Filled 2017-01-15 (×3): qty 2

## 2017-01-15 NOTE — ED Notes (Signed)
MD turned oxygen off, pt desat to 86%. Pt placed on 3L via [place],.

## 2017-01-15 NOTE — ED Notes (Signed)
Pt's oxygen saturation is 88% on RA. MD notified, pt denies SHOB. SPO2 waveform accurate on monitor.

## 2017-01-15 NOTE — ED Triage Notes (Signed)
Pt in with cold symptoms for few days, saw pmd and told to take otc cold med. States not feeling better, also co dark urine.

## 2017-01-15 NOTE — ED Notes (Signed)
Pt's oxygen saturation dropped to 87% after breathing treatment. MD notified. Pt was put on 3L of oxygen with oxygen saturation improving to 92%. Pt currently denies feeling SHOB, chest pressure. Pt's oxygen placement has been changed twice with same reading each time.

## 2017-01-15 NOTE — ED Provider Notes (Signed)
Patient has had 3 nebs and site Medrol and at this point is still D satting down to 86 on room air. We'll admit her   Kristine SPADACCINI, MD 01/15/17 [phone]

## 2017-01-15 NOTE — ED Notes (Signed)
Pt denies hx of COPD, CHF or asthma.

## 2017-01-15 NOTE — H&P (Signed)
[place] [facility] [place] NAME: Kristine Fischer    MR#:  [id number]  DATE OF BIRTH:  December 03, 1950  DATE OF ADMISSION:  01/15/2017  PRIMARY CARE PHYSICIAN: Marguerita Merles, MD   REQUESTING/REFERRING PHYSICIAN: Dr.Malinda  CHIEF COMPLAINT:   Chief Complaint  Patient presents with  . Cough    HISTORY OF PRESENT ILLNESS:  Kristine Fischer  is a 66 y.o. female with a known history of Primary biliary cirrhosis, diastolic CHF, hypertension, hypothyroidism presented to the emergency room complaining of one week of slowly worsening shortness of breath, wheezing and cough. No sick contacts. Afebrile. Patient was treated with multiple steroids, nebulizers treatment in the emergency room with no improvement. She continued to desat with minimal ambulation to 86% on room air. Admit to hospitalist service. Chest x-ray shows bronchitis versus early edema. On Lasix at home. No orthopnea or lower extremity edema. Does not smoke.  PAST MEDICAL HISTORY:   Past Medical History:  Diagnosis Date  . GERD (gastroesophageal reflux disease)   . Hypertension   . Hypothyroidism   . Primary biliary cirrhosis     PAST SURGICAL HISTORY:   Past Surgical History:  Procedure Laterality Date  . ABDOMINAL HYSTERECTOMY    . VENTRAL HERNIA REPAIR N/A 06/04/2015   Procedure: HERNIA REPAIR VENTRAL ADULT;  Surgeon: Leonie Green, MD;  Location: [facility] ORS;  Service: General;  Laterality: N/A;    SOCIAL HISTORY:   Social History  Substance Use Topics  . Smoking status: Never Smoker  . Smokeless tobacco: Never Used  . Alcohol use No    FAMILY HISTORY:   Family History  Problem Relation Age of Onset  . Diabetes Father   . Hypertension Father   . CAD Brother     DRUG ALLERGIES:  No Known Allergies  REVIEW OF SYSTEMS:   Review of Systems  Constitutional: Positive for malaise/fatigue. Negative for chills and fever.  HENT: Negative for sore throat.   Eyes: Negative for  blurred vision, double vision and pain.  Respiratory: Positive for cough, sputum production, shortness of breath and wheezing. Negative for hemoptysis.   Cardiovascular: Negative for chest pain, palpitations, orthopnea and leg swelling.  Gastrointestinal: Negative for abdominal pain, constipation, diarrhea, heartburn, nausea and vomiting.  Genitourinary: Negative for dysuria and hematuria.  Musculoskeletal: Negative for back pain and joint pain.  Skin: Negative for rash.  Neurological: Positive for weakness. Negative for sensory change, speech change, focal weakness and headaches.  Endo/Heme/Allergies: Does not bruise/bleed easily.  Psychiatric/Behavioral: Negative for depression. The patient is not nervous/anxious.     MEDICATIONS AT HOME:   Prior to Admission medications   Medication Sig Start Date End Date Taking? Authorizing Provider  amLODipine (NORVASC) 5 MG tablet Take 5 mg by mouth daily.   Yes Historical Provider, MD  aspirin 81 MG tablet Take 81 mg by mouth daily.   Yes Historical Provider, MD  carvedilol (COREG) 12.5 MG tablet Take 12.5 mg by mouth 2 (two) times daily.   Yes Historical Provider, MD  Cholecalciferol (VITAMIN D3) 5000 UNITS CAPS Take 1 capsule by mouth daily.   Yes Historical Provider, MD  Cyanocobalamin (VITAMIN B-12) 2500 MCG SUBL Place 1 tablet under the tongue daily.   Yes Historical Provider, MD  Garlic 9735 MG CAPS Take 1 capsule by mouth daily.   Yes Historical Provider, MD  hydrochlorothiazide (HYDRODIURIL) 25 MG tablet Take 25 mg by mouth daily.   Yes Historical Provider, MD  levothyroxine (SYNTHROID, LEVOTHROID) 150 MCG  tablet Take 150 mcg by mouth daily.   Yes Historical Provider, MD  pantoprazole (PROTONIX) 40 MG tablet Take 40 mg by mouth daily.   Yes Historical Provider, MD  quinapril (ACCUPRIL) 40 MG tablet Take 40 mg by mouth daily.   Yes Historical Provider, MD  Ursodiol (URSO 250 PO) Take 1 tablet by mouth 2 (two) times daily.   Yes Historical  Provider, MD  albuterol (PROVENTIL HFA;VENTOLIN HFA) 108 (90 Base) MCG/ACT inhaler Inhale 2 puffs into the lungs every 4 (four) hours as needed for wheezing or shortness of breath. 01/15/17   Gregor Hams, MD  azithromycin (ZITHROMAX Z-PAK) 250 MG tablet Take 2 tablets (500 mg) on  Day 1,  followed by 1 tablet (250 mg) once daily on Days 2 through 5. 01/15/17 01/20/17  Gregor Hams, MD  chlorpheniramine-HYDROcodone [facility] PENNKINETIC ER) 10-8 MG/5ML SUER Take 5 mLs by mouth every 12 (twelve) hours as needed. 01/15/17   Gregor Hams, MD  HYDROcodone-acetaminophen (NORCO) 5-325 MG per tablet Take 1-2 tablets by mouth every 4 (four) hours as needed for moderate pain. Patient not taking: Reported on 01/15/2017 06/04/15   Leonie Green, MD  predniSONE (DELTASONE) 20 MG tablet Take 3 tablets (60 mg total) by mouth daily. 01/15/17 01/20/17  Gregor Hams, MD     VITAL SIGNS:  Blood pressure 139/69, pulse 63, temperature 98 F (36.7 C), temperature source Oral, resp. rate 20, height 5\' 8"  (1.727 m), weight 126.6 kg (279 lb), SpO2 98 %.  PHYSICAL EXAMINATION:  Physical Exam  GENERAL:  66 y.o.-year-old patient lying in the bed with no acute distress. Obese EYES: Pupils equal, round, reactive to light and accommodation. No scleral icterus. Extraocular muscles intact.  HEENT: Head atraumatic, normocephalic. Oropharynx and nasopharynx clear. No oropharyngeal erythema, moist oral mucosa  NECK:  Supple, no jugular venous distention. No thyroid enlargement, no tenderness.  LUNGS: Bilateral wheezing and crackles CARDIOVASCULAR: S1, S2 normal. No murmurs, rubs, or gallops.  ABDOMEN: Soft, nontender, nondistended. Bowel sounds present. No organomegaly or mass.  EXTREMITIES: No pedal edema, cyanosis, or clubbing. + 2 pedal & radial pulses b/l.   NEUROLOGIC: Cranial nerves II through XII are intact. No focal Motor or sensory deficits appreciated b/l PSYCHIATRIC: The patient is alert and oriented x  3. Good affect.  SKIN: No obvious rash, lesion, or ulcer.   LABORATORY PANEL:   CBC No results for input(s): WBC, HGB, HCT, PLT in the last 168 hours. ------------------------------------------------------------------------------------------------------------------  Chemistries  No results for input(s): NA, K, CL, CO2, GLUCOSE, BUN, CREATININE, CALCIUM, MG, AST, ALT, ALKPHOS, BILITOT in the last 168 hours.  Invalid input(s): GFRCGP ------------------------------------------------------------------------------------------------------------------  Cardiac Enzymes No results for input(s): TROPONINI in the last 168 hours. ------------------------------------------------------------------------------------------------------------------  RADIOLOGY:  Dg Chest 2 View  Result Date: 01/15/2017 CLINICAL DATA:  Productive cough with phlegm. Shortness of breath. Symptoms for 1 week. EXAM: CHEST  2 VIEW COMPARISON:  Radiographs 11/27/2011 FINDINGS: Heart is borderline enlarged. There is peribronchial cuffing. Streaky right infrahilar atelectasis. Questionable blunting of the costophrenic angles. No consolidation to suggest pneumonia. No pneumothorax. No acute osseous abnormalities. IMPRESSION: Borderline cardiomegaly. Peribronchial cuffing may be secondary to pulmonary edema or bronchitis. Electronically Signed   By: Jeb Levering M.D.   On: 01/15/2017 04:24     IMPRESSION AND PLAN:   * Acute hypoxic respiratory failure due to acute bronchitis and acute on chronic diastolic CHF Wean oxygen as tolerated. Nebs when necessary.  * Acute bronchitis. -IV steroids - Scheduled Nebulizers -  Inhalers -Wean O2 as tolerated - Consult pulmonary if no improvement  * Acute on chronic diastolic CHF, mild 1 dose IV Lasix now. Reassess in the morning. Monitor input and output. Telemetry. Check echocardiogram. Check BNP  * Hypertension. Continue home medications.  * DVT prophylaxis with  Lovenox.  All the records are reviewed and case discussed with ED provider. Management plans discussed with the patient, family and they are in agreement.  CODE STATUS: FULL CODE  TOTAL TIME TAKING CARE OF THIS PATIENT: 40 minutes.   Hillary Bow R M.D on 01/15/2017 at 12:50 PM  Between 7am to 6pm - Pager - [phone]  After 6pm go to [email or web address] - password [email or web address] [place] [facility]  Office  [phone]  CC: Primary care physician; Marguerita Merles, MD  Note: This dictation was prepared with Dragon dictation along with smaller phrase technology. Any transcriptional errors that result from this process are unintentional.

## 2017-01-15 NOTE — ED Provider Notes (Signed)
[facility] Emergency Department Provider Note    First MD Initiated Contact with Patient 01/15/17 [phone]     (approximate)  I have reviewed the triage vital signs and the nursing notes.   HISTORY  Chief Complaint Cough   HPI Kristine Fischer is a 66 y.o. female presents to the emergency department 1 week history of cough congestion with intermittent fever.   Past Medical History:  Diagnosis Date  . GERD (gastroesophageal reflux disease)   . Hypertension   . Hypothyroidism   . Primary biliary cirrhosis     There are no active problems to display for this patient.   Past Surgical History:  Procedure Laterality Date  . ABDOMINAL HYSTERECTOMY    . VENTRAL HERNIA REPAIR N/A 06/04/2015   Procedure: HERNIA REPAIR VENTRAL ADULT;  Surgeon: Leonie Green, MD;  Location: [facility] ORS;  Service: General;  Laterality: N/A;    Prior to Admission medications   Medication Sig Start Date End Date Taking? Authorizing Provider  amLODipine (NORVASC) 5 MG tablet Take 5 mg by mouth daily.    Historical Provider, MD  aspirin 81 MG tablet Take 81 mg by mouth daily.    Historical Provider, MD  carvedilol (COREG) 12.5 MG tablet Take 12.5 mg by mouth 2 (two) times daily.    Historical Provider, MD  Cholecalciferol (VITAMIN D3) 5000 UNITS CAPS Take 1 capsule by mouth daily.    Historical Provider, MD  Cyanocobalamin (VITAMIN B-12) 2500 MCG SUBL Place 1 tablet under the tongue daily.    Historical Provider, MD  Garlic 8341 MG CAPS Take 1 capsule by mouth daily.    Historical Provider, MD  hydrochlorothiazide (HYDRODIURIL) 25 MG tablet Take 25 mg by mouth daily.    Historical Provider, MD  HYDROcodone-acetaminophen (NORCO) 5-325 MG per tablet Take 1-2 tablets by mouth every 4 (four) hours as needed for moderate pain. 06/04/15   Leonie Green, MD  levothyroxine (SYNTHROID, LEVOTHROID) 150 MCG tablet Take 150 mcg by mouth daily.    Historical Provider, MD  pantoprazole  (PROTONIX) 40 MG tablet Take 40 mg by mouth daily.    Historical Provider, MD  quinapril (ACCUPRIL) 40 MG tablet Take 40 mg by mouth daily.    Historical Provider, MD  Ursodiol (URSO 250 PO) Take 1 tablet by mouth 2 (two) times daily.    Historical Provider, MD    Allergies Patient has no known allergies.  No family history on file.  Social History Social History  Substance Use Topics  . Smoking status: Never Smoker  . Smokeless tobacco: Never Used  . Alcohol use No    Review of Systems Constitutional:Positive fever/chills Eyes: No visual changes. ENT: No sore throat. Cardiovascular: Denies chest pain. Respiratory: Denies shortness of breath. Positive for cough Gastrointestinal: No abdominal pain.  No nausea, no vomiting.  No diarrhea.  No constipation. Genitourinary: Negative for dysuria. Musculoskeletal: Negative for back pain. Skin: Negative for rash. Neurological: Negative for headaches, focal weakness or numbness.  10-point ROS otherwise negative.  ____________________________________________   PHYSICAL EXAM:  VITAL SIGNS: ED Triage Vitals  Enc Vitals Group     BP 01/15/17 0355 125/62     Pulse Rate 01/15/17 0355 70     Resp 01/15/17 0355 18     Temp 01/15/17 0355 (!) 100.4 F (38 C)     Temp src --      SpO2 01/15/17 0357 92 %     Weight 01/15/17 0355 282 lb (127.9 kg)  Height 01/15/17 0355 5\' 8"  (1.727 m)     Head Circumference --      Peak Flow --      Pain Score --      Pain Loc --      Pain Edu? --      Excl. in [place]? --     Constitutional: Alert and oriented. Well appearing and in no acute distress. Eyes: Conjunctivae are normal. PERRL. EOMI. Head: Atraumatic. Nose: No congestion/rhinnorhea. Mouth/Throat: Mucous membranes are moist.  Oropharynx non-erythematous. Neck: No stridor.  No meningeal signs. Cardiovascular: Normal rate, regular rhythm. Good peripheral circulation. Grossly normal heart sounds. Respiratory: Normal respiratory effort.   No retractions. Bibasilar rhonchi. Gastrointestinal: Soft and nontender. No distention.  Musculoskeletal: No lower extremity tenderness nor edema. No gross deformities of extremities. Neurologic:  Normal speech and language. No gross focal neurologic deficits are appreciated.  Skin:  Skin is warm, dry and intact. No rash noted. Psychiatric: Mood and affect are normal. Speech and behavior are normal.    RADIOLOGY I, [place], personally viewed and evaluated these images (plain radiographs) as part of my medical decision making, as well as reviewing the written report by the radiologist.  Dg Chest 2 View  Result Date: 01/15/2017 CLINICAL DATA:  Productive cough with phlegm. Shortness of breath. Symptoms for 1 week. EXAM: CHEST  2 VIEW COMPARISON:  Radiographs 11/27/2011 FINDINGS: Heart is borderline enlarged. There is peribronchial cuffing. Streaky right infrahilar atelectasis. Questionable blunting of the costophrenic angles. No consolidation to suggest pneumonia. No pneumothorax. No acute osseous abnormalities. IMPRESSION: Borderline cardiomegaly. Peribronchial cuffing may be secondary to pulmonary edema or bronchitis. Electronically Signed   By: Jeb Levering M.D.   On: 01/15/2017 04:24      Procedures   ____________________________________________   INITIAL IMPRESSION / ASSESSMENT AND PLAN / ED COURSE  Pertinent labs & imaging results that were available during my care of the patient were reviewed by me and considered in my medical decision making (see chart for details).  Patient given albuterol nebulized treatment, Tussionex and azithromycin will be prescribed same for home      ____________________________________________  FINAL CLINICAL IMPRESSION(S) / ED DIAGNOSES  Final diagnoses:  Acute bronchitis, unspecified organism     MEDICATIONS GIVEN DURING THIS VISIT:  Medications  azithromycin (ZITHROMAX) tablet 500 mg (not administered)     NEW  OUTPATIENT MEDICATIONS STARTED DURING THIS VISIT:  New Prescriptions   No medications on file    Modified Medications   No medications on file    Discontinued Medications   No medications on file     Note:  This document was prepared using Dragon voice recognition software and may include unintentional dictation errors.    Gregor Hams, MD 01/15/17 [phone]

## 2017-01-16 MED ORDER — IPRATROPIUM-ALBUTEROL 0.5-2.5 (3) MG/3ML IN SOLN
3.0000 mL | Freq: Four times a day (QID) | RESPIRATORY_TRACT | Status: DC
  Administered 2017-01-16 – 2017-01-17 (×4): 3 mL via RESPIRATORY_TRACT
  Filled 2017-01-16 (×4): qty 3

## 2017-01-16 MED ORDER — GUAIFENESIN-DM 100-10 MG/5ML PO SYRP
5.0000 mL | ORAL_SOLUTION | ORAL | Status: DC | PRN
  Administered 2017-01-16: 05:00:00 5 mL via ORAL
  Filled 2017-01-16: qty 5

## 2017-01-16 MED ORDER — CARVEDILOL 3.125 MG PO TABS
3.1250 mg | ORAL_TABLET | Freq: Two times a day (BID) | ORAL | Status: DC
  Administered 2017-01-16 – 2017-01-17 (×2): 3.125 mg via ORAL
  Filled 2017-01-16 (×2): qty 1

## 2017-01-16 NOTE — Progress Notes (Addendum)
SATURATION QUALIFICATIONS: (This note is used to comply with regulatory documentation for home oxygen)  Patient Saturations on Room Air at Rest = 93%  Patient Saturations on Room Air while Ambulating = 85% -Ambulated around nurses station x1.   Patient recovered to 91% within one minute of returning to recliner on room air.   Madlyn Frankel, RN

## 2017-01-16 NOTE — Progress Notes (Signed)
[facility] [place] NAME: Syona Wroblewski    MR#:  [id number]  DATE OF BIRTH:  December 12, 1950  SUBJECTIVE:  CHIEF COMPLAINT:   Chief Complaint  Patient presents with  . Cough   Continues to have shortness of breath and cough. No chest pain or orthopnea or lower extremity edema. Afebrile.  REVIEW OF SYSTEMS:    Review of Systems  Constitutional: Positive for malaise/fatigue. Negative for chills and fever.  HENT: Negative for sore throat.   Eyes: Negative for blurred vision, double vision and pain.  Respiratory: Positive for shortness of breath and wheezing. Negative for hemoptysis.   Cardiovascular: Negative for chest pain, palpitations, orthopnea and leg swelling.  Gastrointestinal: Negative for abdominal pain, constipation, diarrhea, heartburn, nausea and vomiting.  Genitourinary: Negative for dysuria and hematuria.  Musculoskeletal: Negative for back pain and joint pain.  Skin: Negative for rash.  Neurological: Positive for weakness. Negative for sensory change, speech change, focal weakness and headaches.  Endo/Heme/Allergies: Does not bruise/bleed easily.  Psychiatric/Behavioral: Negative for depression. The patient is not nervous/anxious.     DRUG ALLERGIES:  No Known Allergies  VITALS:  Blood pressure 114/66, pulse (!) 58, temperature (!) 96.6 F (35.9 C), temperature source Axillary, resp. rate (!) 21, height 5\' 8"  (1.727 m), weight 127.6 kg (281 lb 3.2 oz), SpO2 95 %.  PHYSICAL EXAMINATION:   Physical Exam  GENERAL:  66 y.o.-year-old patient lying in the bed . Obese EYES: Pupils equal, round, reactive to light and accommodation. No scleral icterus. Extraocular muscles intact.  HEENT: Head atraumatic, normocephalic. Oropharynx and nasopharynx clear.  NECK:  Supple, no jugular venous distention. No thyroid enlargement, no tenderness.  LUNGS: Conversational dyspnea. Decreased air entry. Bilateral wheezing. CARDIOVASCULAR: S1, S2  normal. No murmurs, rubs, or gallops.  ABDOMEN: Soft, nontender, nondistended. Bowel sounds present. No organomegaly or mass.  EXTREMITIES: No cyanosis, clubbing or edema b/l.    NEUROLOGIC: Cranial nerves II through XII are intact. No focal Motor or sensory deficits b/l.   PSYCHIATRIC: The patient is alert and oriented x 3.  SKIN: No obvious rash, lesion, or ulcer.   LABORATORY PANEL:   CBC No results for input(s): WBC, HGB, HCT, PLT in the last 168 hours. ------------------------------------------------------------------------------------------------------------------ Chemistries  No results for input(s): NA, K, CL, CO2, GLUCOSE, BUN, CREATININE, CALCIUM, MG, AST, ALT, ALKPHOS, BILITOT in the last 168 hours.  Invalid input(s): GFRCGP ------------------------------------------------------------------------------------------------------------------  Cardiac Enzymes No results for input(s): TROPONINI in the last 168 hours. ------------------------------------------------------------------------------------------------------------------  RADIOLOGY:  Dg Chest 2 View  Result Date: 01/15/2017 CLINICAL DATA:  Productive cough with phlegm. Shortness of breath. Symptoms for 1 week. EXAM: CHEST  2 VIEW COMPARISON:  Radiographs 11/27/2011 FINDINGS: Heart is borderline enlarged. There is peribronchial cuffing. Streaky right infrahilar atelectasis. Questionable blunting of the costophrenic angles. No consolidation to suggest pneumonia. No pneumothorax. No acute osseous abnormalities. IMPRESSION: Borderline cardiomegaly. Peribronchial cuffing may be secondary to pulmonary edema or bronchitis. Electronically Signed   By: Jeb Levering M.D.   On: 01/15/2017 04:24     ASSESSMENT AND PLAN:   * Acute hypoxic respiratory failure due to acute bronchitis Wean oxygen as tolerated. Nebs when necessary.  * Acute bronchitis. -IV steroids - Scheduled Nebulizers - Inhalers -Wean O2 as tolerated -  Consult pulmonary if no improvement  * CHF? No history of congestive heart failure. Mild elevation in BNP. Check echocardiogram.  * Hypertension. Continue home medications.  * DVT prophylaxis with Lovenox.  All the records are  reviewed and case discussed with Care Management/Social Workerr. Management plans discussed with the patient, family and they are in agreement.  CODE STATUS: FULL CODE  DVT Prophylaxis: SCDs  TOTAL TIME TAKING CARE OF THIS PATIENT: 30 minutes.   POSSIBLE D/C IN 1-2 DAYS, DEPENDING ON CLINICAL CONDITION.  Hillary Bow R M.D on 01/16/2017 at 12:04 PM  Between 7am to 6pm - Pager - [phone]  After 6pm go to [email or web address] - password [email or web address] [facility]  Office  [phone]  CC: Primary care physician; Marguerita Merles, MD  Note: This dictation was prepared with Dragon dictation along with smaller phrase technology. Any transcriptional errors that result from this process are unintentional.

## 2017-01-17 MED ORDER — HYDROCOD POLST-CPM POLST ER 10-8 MG/5ML PO SUER: 5.0000 mL | Freq: Two times a day (BID) | ORAL | 0 refills | Status: DC | PRN

## 2017-01-17 MED ORDER — PREDNISONE 50 MG PO TABS: 50.0000 mg | ORAL_TABLET | Freq: Every day | ORAL | 0 refills | Status: AC

## 2017-01-17 MED ORDER — ALBUTEROL SULFATE HFA 108 (90 BASE) MCG/ACT IN AERS: 2.0000 | INHALATION_SPRAY | RESPIRATORY_TRACT | 0 refills | Status: AC | PRN

## 2017-01-17 MED ORDER — AZITHROMYCIN 250 MG PO TABS: 250.0000 mg | ORAL_TABLET | Freq: Every day | ORAL | 0 refills | Status: DC

## 2017-01-17 NOTE — Progress Notes (Signed)
MD order received in [facility] to discharge pt home today; verbally reviewed AVS with pt; gave Rxs to pt; no questions voiced at this time; pt's discharge pending arrival of her ride home

## 2017-01-17 NOTE — Discharge Instructions (Signed)
Resume diet and activity as before ° ° °

## 2017-01-17 NOTE — Progress Notes (Signed)
Pt's ride present for discharge; pt discharged via wheelchair by nursing to the visitor's entrance 

## 2017-01-18 NOTE — Discharge Summary (Signed)
Kristine Fischer NAME: Kristine Fischer    MR#:  [id number]  DATE OF BIRTH:  04-27-1951  DATE OF ADMISSION:  01/15/2017 ADMITTING PHYSICIAN: Hillary Bow, MD  DATE OF DISCHARGE: 01/17/2017 11:09 AM  PRIMARY CARE PHYSICIAN: Marguerita Merles, MD   ADMISSION DIAGNOSIS:  Hypoxia [R09.02] Acute bronchitis, unspecified organism [J20.9]  DISCHARGE DIAGNOSIS:  Active Problems:   Acute bronchitis   SECONDARY DIAGNOSIS:   Past Medical History:  Diagnosis Date  . GERD (gastroesophageal reflux disease)   . Hypertension   . Hypothyroidism   . Primary biliary cirrhosis      ADMITTING HISTORY  HISTORY OF PRESENT ILLNESS:  Kristine Fischer  is a 66 y.o. female with a known history of Primary biliary cirrhosis, diastolic CHF, hypertension, hypothyroidism presented to the emergency room complaining of one week of slowly worsening shortness of breath, wheezing and cough. No sick contacts. Afebrile. Patient was treated with multiple steroids, nebulizers treatment in the emergency room with no improvement. She continued to desat with minimal ambulation to 86% on room air. Admit to hospitalist service. Chest x-ray shows bronchitis versus early edema. On Lasix at home. No orthopnea or lower extremity edema. Does not smoke.   HOSPITAL COURSE:   * Acute hypoxic respiratory failure due to acute bronchitis Wean oxygen as tolerated. Nebs when necessary.  * Acute bronchitis. -IV steroids  In the hospital. Changed to oral prednisone at discharge. - Scheduled Nebulizers - Inhalers Patient was weaned off oxygen by day of discharge and her saturations are 94%.  * Elevated BNP. Patient had mild elevation of BNP. Chest x-ray showed no edema. No lower extremity edema.  * Hypertension. Continue home medications.  Stable for discharge home.  CONSULTS OBTAINED:    DRUG ALLERGIES:  No Known Allergies  DISCHARGE MEDICATIONS:   Discharge Medication List as of  01/17/2017 10:42 AM    CONTINUE these medications which have CHANGED   Details  albuterol (PROVENTIL HFA;VENTOLIN HFA) 108 (90 Base) MCG/ACT inhaler Inhale 2 puffs into the lungs every 4 (four) hours as needed for wheezing or shortness of breath., Starting Sun 01/17/2017, Normal    azithromycin (ZITHROMAX Z-PAK) 250 MG tablet Take 1 tablet (250 mg total) by mouth daily., Starting Sun 01/17/2017, Print    chlorpheniramine-HYDROcodone (TUSSIONEX PENNKINETIC ER) 10-8 MG/5ML SUER Take 5 mLs by mouth every 12 (twelve) hours as needed., Starting Sun 01/17/2017, Print    predniSONE (DELTASONE) 50 MG tablet Take 1 tablet (50 mg total) by mouth daily with breakfast., Starting Sun 01/17/2017, Until Thu 01/21/2017, Print      CONTINUE these medications which have NOT CHANGED   Details  amLODipine (NORVASC) 5 MG tablet Take 5 mg by mouth daily., Historical Med    aspirin 81 MG tablet Take 81 mg by mouth daily., Historical Med    carvedilol (COREG) 12.5 MG tablet Take 12.5 mg by mouth 2 (two) times daily., Historical Med    Cholecalciferol (VITAMIN D3) 5000 UNITS CAPS Take 1 capsule by mouth daily., Historical Med    Cyanocobalamin (VITAMIN B-12) 2500 MCG SUBL Place 1 tablet under the tongue daily., Historical Med    Garlic 7782 MG CAPS Take 1 capsule by mouth daily., Historical Med    hydrochlorothiazide (HYDRODIURIL) 25 MG tablet Take 25 mg by mouth daily., Historical Med    levothyroxine (SYNTHROID, LEVOTHROID) 150 MCG tablet Take 150 mcg by mouth daily., Historical Med    pantoprazole (PROTONIX) 40 MG tablet Take 40 mg by mouth  daily., Historical Med    quinapril (ACCUPRIL) 40 MG tablet Take 40 mg by mouth daily., Historical Med    Ursodiol (URSO 250 PO) Take 1 tablet by mouth 2 (two) times daily., Historical Med    HYDROcodone-acetaminophen (NORCO) 5-325 MG per tablet Take 1-2 tablets by mouth every 4 (four) hours as needed for moderate pain., Starting Tue 06/04/2015, Print        Today    VITAL SIGNS:  Blood pressure 124/65, pulse (!) 53, temperature 97.9 F (36.6 C), temperature source Oral, resp. rate 20, height 5\' 8"  (1.727 m), weight 128.4 kg (283 lb 1.6 oz), SpO2 92 %.  I/O:  No intake or output data in the 24 hours ending 01/18/17 1807  PHYSICAL EXAMINATION:  Physical Exam  GENERAL:  66 y.o.-year-old patient lying in the bed with no acute distress.  LUNGS: Normal breath sounds bilaterally, no wheezing, rales,rhonchi or crepitation. No use of accessory muscles of respiration.  CARDIOVASCULAR: S1, S2 normal. No murmurs, rubs, or gallops.  ABDOMEN: Soft, non-tender, non-distended. Bowel sounds present. No organomegaly or mass.  NEUROLOGIC: Moves all 4 extremities. PSYCHIATRIC: The patient is alert and oriented x 3.  SKIN: No obvious rash, lesion, or ulcer.   DATA REVIEW:   CBC No results for input(s): WBC, HGB, HCT, PLT in the last 168 hours.  Chemistries  No results for input(s): NA, K, CL, CO2, GLUCOSE, BUN, CREATININE, CALCIUM, MG, AST, ALT, ALKPHOS, BILITOT in the last 168 hours.  Invalid input(s): GFRCGP  Cardiac Enzymes No results for input(s): TROPONINI in the last 168 hours.  Microbiology Results  Results for orders placed or performed in visit on 07/12/12  Ear culture     Status: None   Collection Time: 07/12/12 11:00 AM  Result Value Ref Range Status   Micro Text Report   Final       SOURCE: LEFT EAR    ORGANISM 1                RARE COAGULASE NEGATIVE STAPHYLOCOCCUS   COMMENT                   CONSISTENT WITH NORMAL SKIN FLORA   GRAM STAIN                NO WHITE BLOOD CELLS   GRAM STAIN                RARE GRAM POSITIVE COCCI   ANTIBIOTIC                                                        RADIOLOGY:  No results found.  Follow up with PCP in 1 week.  Management plans discussed with the patient, family and they are in agreement.  CODE STATUS:  Code Status History    Date Active Date Inactive Code Status Order ID Comments  User Context   01/15/2017  9:26 AM 01/17/2017  2:19 PM Full Code [id number]  Hillary Bow, MD ED      TOTAL TIME TAKING CARE OF THIS PATIENT ON DAY OF DISCHARGE: more than 30 minutes.   Hillary Bow R M.D on 01/18/2017 at 6:07 PM  Between 7am to 6pm - Pager - [phone]  After 6pm go to [email or web address] - password [email or web address] [facility]  Office  [phone]  CC: Primary care physician; Marguerita Merles, MD  Note: This dictation was prepared with Dragon dictation along with smaller phrase technology. Any transcriptional errors that result from this process are unintentional.

## 2017-04-02 ENCOUNTER — Other Ambulatory Visit: Payer: Self-pay | Admitting: Gastroenterology

## 2017-04-02 DIAGNOSIS — K743 Primary biliary cirrhosis: Secondary | ICD-10-CM

## 2017-04-06 ENCOUNTER — Ambulatory Visit
Admission: RE | Admit: 2017-04-06 | Discharge: 2017-04-06 | Disposition: A | Discharge status: Home / Self Care | Payer: Medicare HMO | Source: Ambulatory Visit | Attending: Gastroenterology | Admitting: Gastroenterology

## 2017-04-06 ENCOUNTER — Ambulatory Visit: Payer: Medicare HMO

## 2017-04-06 DIAGNOSIS — K8020 Calculus of gallbladder without cholecystitis without obstruction: Secondary | ICD-10-CM | POA: Diagnosis not present

## 2017-04-06 DIAGNOSIS — K743 Primary biliary cirrhosis: Secondary | ICD-10-CM | POA: Insufficient documentation

## 2017-04-16 ENCOUNTER — Other Ambulatory Visit: Payer: Self-pay | Admitting: Family Medicine

## 2017-04-16 DIAGNOSIS — Z13820 Encounter for screening for osteoporosis: Secondary | ICD-10-CM

## 2017-04-16 DIAGNOSIS — Z1231 Encounter for screening mammogram for malignant neoplasm of breast: Secondary | ICD-10-CM

## 2017-06-07 ENCOUNTER — Ambulatory Visit
Admission: RE | Admit: 2017-06-07 | Discharge: 2017-06-07 | Disposition: A | Discharge status: Home / Self Care | Payer: Medicare HMO | Source: Ambulatory Visit | Attending: Family Medicine | Admitting: Family Medicine

## 2017-06-07 DIAGNOSIS — Z13820 Encounter for screening for osteoporosis: Secondary | ICD-10-CM | POA: Insufficient documentation

## 2017-06-07 DIAGNOSIS — Z1231 Encounter for screening mammogram for malignant neoplasm of breast: Secondary | ICD-10-CM | POA: Diagnosis present

## 2017-07-06 ENCOUNTER — Encounter: Payer: Self-pay | Admitting: Anesthesiology

## 2017-07-06 ENCOUNTER — Ambulatory Visit: Payer: Medicare HMO | Admitting: Anesthesiology

## 2017-07-06 ENCOUNTER — Ambulatory Visit
Admission: RE | Admit: 2017-07-06 | Discharge: 2017-07-06 | Disposition: A | Discharge status: Home / Self Care | Payer: Medicare HMO | Source: Ambulatory Visit | Attending: Gastroenterology | Admitting: Gastroenterology

## 2017-07-06 ENCOUNTER — Encounter
Admission: RE | Disposition: A | Discharge status: Home / Self Care | Payer: Self-pay | Source: Ambulatory Visit | Attending: Gastroenterology

## 2017-07-06 DIAGNOSIS — K743 Primary biliary cirrhosis: Secondary | ICD-10-CM | POA: Diagnosis not present

## 2017-07-06 DIAGNOSIS — K219 Gastro-esophageal reflux disease without esophagitis: Secondary | ICD-10-CM | POA: Diagnosis not present

## 2017-07-06 DIAGNOSIS — Z7982 Long term (current) use of aspirin: Secondary | ICD-10-CM | POA: Insufficient documentation

## 2017-07-06 DIAGNOSIS — K3189 Other diseases of stomach and duodenum: Secondary | ICD-10-CM | POA: Insufficient documentation

## 2017-07-06 DIAGNOSIS — E039 Hypothyroidism, unspecified: Secondary | ICD-10-CM | POA: Insufficient documentation

## 2017-07-06 DIAGNOSIS — D120 Benign neoplasm of cecum: Secondary | ICD-10-CM | POA: Insufficient documentation

## 2017-07-06 DIAGNOSIS — K635 Polyp of colon: Secondary | ICD-10-CM | POA: Diagnosis not present

## 2017-07-06 DIAGNOSIS — Z79899 Other long term (current) drug therapy: Secondary | ICD-10-CM | POA: Insufficient documentation

## 2017-07-06 DIAGNOSIS — Z86010 Personal history of colonic polyps: Secondary | ICD-10-CM | POA: Insufficient documentation

## 2017-07-06 DIAGNOSIS — K228 Other specified diseases of esophagus: Secondary | ICD-10-CM | POA: Diagnosis not present

## 2017-07-06 DIAGNOSIS — K2950 Unspecified chronic gastritis without bleeding: Secondary | ICD-10-CM | POA: Diagnosis not present

## 2017-07-06 DIAGNOSIS — K621 Rectal polyp: Secondary | ICD-10-CM | POA: Insufficient documentation

## 2017-07-06 DIAGNOSIS — K766 Portal hypertension: Secondary | ICD-10-CM | POA: Diagnosis not present

## 2017-07-06 HISTORY — PX: COLONOSCOPY WITH PROPOFOL: SHX5780

## 2017-07-06 HISTORY — PX: ESOPHAGOGASTRODUODENOSCOPY (EGD) WITH PROPOFOL: SHX5813

## 2017-07-06 LAB — CBC WITH DIFFERENTIAL/PLATELET
BASOS ABS: 0.1 10*3/uL (ref 0–0.1)
BASOS PCT: 1 %
EOS ABS: 0.3 10*3/uL (ref 0–0.7)
EOS PCT: 4 %
HCT: 45.5 % (ref 35.0–47.0)
Hemoglobin: 15.7 g/dL (ref 12.0–16.0)
Lymphocytes Relative: 29 %
Lymphs Abs: 2 10*3/uL (ref 1.0–3.6)
MCH: 31.1 pg (ref 26.0–34.0)
MCHC: 34.5 g/dL (ref 32.0–36.0)
MCV: 90 fL (ref 80.0–100.0)
Monocytes Absolute: 0.5 10*3/uL (ref 0.2–0.9)
Monocytes Relative: 7 %
NEUTROS PCT: 59 %
Neutro Abs: 4 10*3/uL (ref 1.4–6.5)
PLATELETS: 132 10*3/uL — AB (ref 150–440)
RBC: 5.06 MIL/uL (ref 3.80–5.20)
RDW: 14.3 % (ref 11.5–14.5)
WBC: 6.8 10*3/uL (ref 3.6–11.0)

## 2017-07-06 LAB — PROTIME-INR
INR: 1.1
PROTHROMBIN TIME: 14.1 s (ref 11.4–15.2)

## 2017-07-06 SURGERY — COLONOSCOPY WITH PROPOFOL
Anesthesia: General

## 2017-07-06 MED ORDER — IPRATROPIUM-ALBUTEROL 0.5-2.5 (3) MG/3ML IN SOLN
3.0000 mL | Freq: Four times a day (QID) | RESPIRATORY_TRACT | Status: DC
  Administered 2017-07-06: 3 mL via RESPIRATORY_TRACT

## 2017-07-06 MED ORDER — SODIUM CHLORIDE 0.9 % IV SOLN
INTRAVENOUS | Status: DC
  Administered 2017-07-06: 13:00:00 via INTRAVENOUS

## 2017-07-06 MED ORDER — EPHEDRINE SULFATE 50 MG/ML IJ SOLN
INTRAMUSCULAR | Status: DC | PRN
  Administered 2017-07-06 (×2): 10 mg via INTRAVENOUS
  Administered 2017-07-06 (×2): 5 mg via INTRAVENOUS

## 2017-07-06 MED ORDER — PHENYLEPHRINE HCL 10 MG/ML IJ SOLN
INTRAMUSCULAR | Status: DC | PRN
  Administered 2017-07-06 (×2): 50 ug via INTRAVENOUS

## 2017-07-06 MED ORDER — PROPOFOL 500 MG/50ML IV EMUL
INTRAVENOUS | Status: AC
  Filled 2017-07-06: qty 100

## 2017-07-06 MED ORDER — GLYCOPYRROLATE 0.2 MG/ML IJ SOLN
INTRAMUSCULAR | Status: AC
  Filled 2017-07-06: qty 1

## 2017-07-06 MED ORDER — GLYCOPYRROLATE 0.2 MG/ML IJ SOLN
INTRAMUSCULAR | Status: DC | PRN
  Administered 2017-07-06: 0.2 mg via INTRAVENOUS

## 2017-07-06 MED ORDER — IPRATROPIUM-ALBUTEROL 0.5-2.5 (3) MG/3ML IN SOLN
RESPIRATORY_TRACT | Status: AC
  Administered 2017-07-06: 3 mL via RESPIRATORY_TRACT
  Filled 2017-07-06: qty 3

## 2017-07-06 MED ORDER — PROPOFOL 10 MG/ML IV BOLUS
INTRAVENOUS | Status: AC
  Filled 2017-07-06: qty 20

## 2017-07-06 MED ORDER — PROPOFOL 500 MG/50ML IV EMUL
INTRAVENOUS | Status: DC | PRN
  Administered 2017-07-06: 160 ug/kg/min via INTRAVENOUS

## 2017-07-06 MED ORDER — SODIUM CHLORIDE 0.9 % IV SOLN: INTRAVENOUS | Status: DC

## 2017-07-06 MED ORDER — PROPOFOL 10 MG/ML IV BOLUS
INTRAVENOUS | Status: DC | PRN
  Administered 2017-07-06: 60 mg via INTRAVENOUS

## 2017-07-06 MED ORDER — ONDANSETRON HCL 4 MG/2ML IJ SOLN: 4.0000 mg | Freq: Once | INTRAMUSCULAR | Status: DC | PRN

## 2017-07-06 MED ORDER — FENTANYL CITRATE (PF) 100 MCG/2ML IJ SOLN: 25.0000 ug | INTRAMUSCULAR | Status: DC | PRN

## 2017-07-06 MED ORDER — PROPOFOL 500 MG/50ML IV EMUL
INTRAVENOUS | Status: AC
  Filled 2017-07-06: qty 50

## 2017-07-06 NOTE — Anesthesia Preprocedure Evaluation (Addendum)
Anesthesia Evaluation  Patient identified by MRN, date of birth, ID band Patient awake    Reviewed: Allergy & Precautions, NPO status , Patient's Chart, lab work & pertinent test results, reviewed documented beta blocker date and time   History of Anesthesia Complications Negative for: history of anesthetic complications  Airway Mallampati: II  TM Distance: >3 FB Neck ROM: Full    Dental  (+) Teeth Intact   Pulmonary COPD,  COPD inhaler,    Pulmonary exam normal        Cardiovascular hypertension, Pt. on medications and Pt. on home beta blockers Normal cardiovascular exam     Neuro/Psych negative neurological ROS  negative psych ROS   GI/Hepatic GERD  Medicated and Controlled,(+) Cirrhosis       ,   Endo/Other  Hypothyroidism Morbid obesity  Renal/GU negative Renal ROS  negative genitourinary   Musculoskeletal negative musculoskeletal ROS (+)   Abdominal Normal abdominal exam  (+)   Peds negative pediatric ROS (+)  Hematology negative hematology ROS (+)   Anesthesia Other Findings   Reproductive/Obstetrics                            Anesthesia Physical  Anesthesia Plan  ASA: III  Anesthesia Plan: General   Post-op Pain Management:    Induction: Intravenous  PONV Risk Score and Plan:   Airway Management Planned: Nasal Cannula  Additional Equipment:   Intra-op Plan:   Post-operative Plan:   Informed Consent: I have reviewed the patients History and Physical, chart, labs and discussed the procedure including the risks, benefits and alternatives for the proposed anesthesia with the patient or authorized representative who has indicated his/her understanding and acceptance.     Plan Discussed with: CRNA and Surgeon  Anesthesia Plan Comments:         Anesthesia Quick Evaluation

## 2017-07-06 NOTE — H&P (Signed)
Outpatient short stay form Pre-procedure 07/06/2017 1:36 PM Lollie Sails MD  Primary Physician: Dr. Delight Stare  Reason for visit:  EGD and colonoscopy  History of present illness:  Patient is a 66 year old female presenting today as above. She has personal history of primary biliary cirrhosis. She also has a personal history of adenomatous colon polyps. She is presenting today for an upper scope for variceal screening and for lower scope to follow up on the personal history of colon polyps. She tolerated her prep well. She takes no blood thinners. She does take 81 mg aspirin was held today. She takes no other aspirin products. She tolerated her prep well.    Current Facility-Administered Medications:  .  0.9 %  sodium chloride infusion, , Intravenous, Continuous, Lollie Sails, MD, Last Rate: 20 mL/hr at 07/06/17 1255 .  0.9 %  sodium chloride infusion, , Intravenous, Continuous, Lollie Sails, MD  Prescriptions Prior to Admission  Medication Sig Dispense Refill Last Dose  . amLODipine (NORVASC) 5 MG tablet Take 5 mg by mouth daily.   07/06/2017 at 0630  . aspirin 81 MG tablet Take 81 mg by mouth daily.   07/05/2017 at Unknown time  . carvedilol (COREG) 12.5 MG tablet Take 12.5 mg by mouth 2 (two) times daily.   07/06/2017 at 0630  . Cholecalciferol (VITAMIN D3) 5000 UNITS CAPS Take 1 capsule by mouth daily.   Past Week at Unknown time  . Cyanocobalamin (VITAMIN B-12) 2500 MCG SUBL Place 1 tablet under the tongue daily.   Past Week at Unknown time  . Garlic 9242 MG CAPS Take 1 capsule by mouth daily.   Past Week at Unknown time  . hydrochlorothiazide (HYDRODIURIL) 25 MG tablet Take 25 mg by mouth daily.   07/05/2017 at 0630  . levothyroxine (SYNTHROID, LEVOTHROID) 150 MCG tablet Take 150 mcg by mouth daily.   07/06/2017 at 0630  . pantoprazole (PROTONIX) 40 MG tablet Take 40 mg by mouth daily.   07/06/2017 at 0630  . Ursodiol (URSO 250 PO) Take 1 tablet by mouth 2 (two) times  daily.   07/05/2017 at Unknown time  . albuterol (PROVENTIL HFA;VENTOLIN HFA) 108 (90 Base) MCG/ACT inhaler Inhale 2 puffs into the lungs every 4 (four) hours as needed for wheezing or shortness of breath. 1 Inhaler 0   . azithromycin (ZITHROMAX Z-PAK) 250 MG tablet Take 1 tablet (250 mg total) by mouth daily. (Patient not taking: Reported on 07/06/2017) 4 each 0 Completed Course at Unknown time  . chlorpheniramine-HYDROcodone (TUSSIONEX PENNKINETIC ER) 10-8 MG/5ML SUER Take 5 mLs by mouth every 12 (twelve) hours as needed. (Patient not taking: Reported on 07/06/2017) 115 mL 0 Not Taking at Unknown time  . HYDROcodone-acetaminophen (NORCO) 5-325 MG per tablet Take 1-2 tablets by mouth every 4 (four) hours as needed for moderate pain. (Patient not taking: Reported on 01/15/2017) 12 tablet 0 Not Taking at Unknown time  . quinapril (ACCUPRIL) 40 MG tablet Take 40 mg by mouth daily.   Not Taking at Unknown time     No Known Allergies   Past Medical History:  Diagnosis Date  . GERD (gastroesophageal reflux disease)   . Hypertension   . Hypothyroidism   . Primary biliary cirrhosis (HCC)     Review of systems:      Physical Exam    Heart and lungs: Regular rate and rhythm without rub or gallop, lungs are bilaterally clear    HEENT: Normocephalic atraumatic eyes are anicteric  Other:     Pertinant exam for procedure: Soft nontender nondistended bowel sounds positive normoactive.    Planned proceedures: EGD, colonoscopy and indicated I have discussed the risks benefits and complications of procedures to include not limited to bleeding, infection, perforation and the risk of sedation and the patient wishes to proceed. procedures.    Lollie Sails, MD Gastroenterology 07/06/2017  1:36 PM

## 2017-07-06 NOTE — Transfer of Care (Signed)
Immediate Anesthesia Transfer of Care Note  Patient: Kristine Fischer  Procedure(s) Performed: Procedure(s): COLONOSCOPY WITH PROPOFOL (N/A) ESOPHAGOGASTRODUODENOSCOPY (EGD) WITH PROPOFOL (N/A)  Patient Location: PACU  Anesthesia Type:General  Level of Consciousness: sedated  Airway & Oxygen Therapy: Patient Spontanous Breathing and Patient connected to nasal cannula oxygen  Post-op Assessment: Report given to RN and Post -op Vital signs reviewed and stable  Post vital signs: Reviewed and stable  Last Vitals:  Vitals:   07/06/17 1232 07/06/17 1624  BP: 135/69   Pulse: (!) 53   Resp: 18   Temp: 37.4 C (!) 36.1 C  SpO2: 98%     Last Pain:  Vitals:   07/06/17 1624  TempSrc: Tympanic         Complications: No apparent anesthesia complications

## 2017-07-06 NOTE — Anesthesia Postprocedure Evaluation (Signed)
Anesthesia Post Note  Patient: NARYA BEAVIN  Procedure(s) Performed: Procedure(s) (LRB): COLONOSCOPY WITH PROPOFOL (N/A) ESOPHAGOGASTRODUODENOSCOPY (EGD) WITH PROPOFOL (N/A)  Patient location during evaluation: Endoscopy Anesthesia Type: General Level of consciousness: awake and alert Pain management: pain level controlled Vital Signs Assessment: post-procedure vital signs reviewed and stable Respiratory status: spontaneous breathing and respiratory function stable Cardiovascular status: stable Anesthetic complications: no     Last Vitals:  Vitals:   07/06/17 1624 07/06/17 1654  BP: 100/64 113/76  Pulse:    Resp:    Temp: (!) 36.1 C   SpO2:      Last Pain:  Vitals:   07/06/17 1624  TempSrc: Tympanic                 Rafael Salway K

## 2017-07-06 NOTE — Op Note (Signed)
[facility] Gastroenterology Patient Name: Kristine Fischer Procedure Date: 07/06/2017 3:21 PM MRN: [id number] Account #: [id number] Date of Birth: 1951/08/09 Admit Type: Outpatient Age: 66 Room: [facility] ENDO ROOM 3 Gender: Female Note Status: Finalized Procedure:            Colonoscopy Indications:          Personal history of colonic polyps Providers:            Lollie Sails, MD Referring MD:         Marguerita Merles, MD (Referring MD) Medicines:            Monitored Anesthesia Care Complications:        No immediate complications. Procedure:            Pre-Anesthesia Assessment:                       - ASA Grade Assessment: III - A patient with severe                        systemic disease.                       After obtaining informed consent, the colonoscope was                        passed under direct vision. Throughout the procedure,                        the patient's blood pressure, pulse, and oxygen                        saturations were monitored continuously. The                        Colonoscope was introduced through the anus and                        advanced to the the cecum, identified by appendiceal                        orifice and ileocecal valve. The colonoscopy was                        performed with moderate difficulty due to a tortuous                        colon. Successful completion of the procedure was aided                        by changing the patient to a supine position, changing                        the patient to a prone position and using manual                        pressure. The patient tolerated the procedure well. The                        quality of the bowel preparation was good. Findings:      A 1 mm polyp  was found in the cecum. The polyp was sessile. The polyp       was removed with a cold biopsy forceps. Resection and retrieval were       complete.      A 2 mm polyp was found in the recto-sigmoid colon.  The polyp was       sessile. The polyp was removed with a cold biopsy forceps. Resection and       retrieval were complete.      A 2 mm polyp was found in the rectum. The polyp was sessile. The polyp       was removed with a cold biopsy forceps. Resection and retrieval were       complete.      The digital rectal exam was normal.      The retroflexed view of the distal rectum and anal verge was normal and       showed no anal or rectal abnormalities otherwise. Impression:           - One 1 mm polyp in the cecum, removed with a cold                        biopsy forceps. Resected and retrieved.                       - One 2 mm polyp at the recto-sigmoid colon, removed                        with a cold biopsy forceps. Resected and retrieved.                       - One 2 mm polyp in the rectum, removed with a cold                        biopsy forceps. Resected and retrieved.                       - The distal rectum and anal verge are normal on                        retroflexion view. Recommendation:       - Discharge patient to home.                       - Discharge patient to home.                       - Await pathology results.                       - Telephone GI clinic for pathology results at                        appointment to be scheduled. Procedure Code(s):    --- Professional ---                       [phone], Colonoscopy, flexible; with biopsy, single or                        multiple Diagnosis Code(s):    --- Professional ---  D12.0, Benign neoplasm of cecum                       D12.7, Benign neoplasm of rectosigmoid junction                       K62.1, Rectal polyp                       Z86.010, Personal history of colonic polyps CPT copyright 2016 American Medical Association. All rights reserved. The codes documented in this report are preliminary and upon coder review may  be revised to meet current compliance requirements. Lollie Sails, MD 07/06/2017 4:22:11 PM This report has been signed electronically. Number of Addenda: 0 Note Initiated On: 07/06/2017 3:21 PM Scope Withdrawal Time: 0 hours 11 minutes 47 seconds  Total Procedure Duration: 0 hours 23 minutes 47 seconds       [facility]

## 2017-07-06 NOTE — Anesthesia Post-op Follow-up Note (Signed)
Anesthesia QCDR form completed.        

## 2017-07-06 NOTE — Op Note (Signed)
[facility] Gastroenterology Patient Name: Kristine Fischer Procedure Date: 07/06/2017 3:21 PM MRN: [id number] Account #: [id number] Date of Birth: [birth year] Admit Type: Outpatient Age: 66 Room: [facility] ENDO ROOM 3 Gender: Female Note Status: Finalized Procedure:            Upper GI endoscopy Indications:          Cirrhosis rule out esophageal varices Providers:            Lollie Sails, MD Referring MD:         Marguerita Merles, MD (Referring MD) Medicines:            Monitored Anesthesia Care Complications:        No immediate complications. Procedure:            Pre-Anesthesia Assessment:                       - ASA Grade Assessment: III - A patient with severe                        systemic disease.                       After obtaining informed consent, the endoscope was                        passed under direct vision. Throughout the procedure,                        the patient's blood pressure, pulse, and oxygen                        saturations were monitored continuously. The Endoscope                        was introduced through the mouth, and advanced to the                        third part of duodenum. The upper GI endoscopy was                        accomplished without difficulty. The patient tolerated                        the procedure well. Findings:      The Z-line was irregular. Biopsies were taken with a cold forceps for       histology.      there is no evidence of esophageal varices.      Minimal portal hypertensive gastropathy versus mild gastritis was found       in the gastric body. Biopsies were taken with a cold forceps for       histology.      The cardia and gastric fundus were normal on retroflexion.      Possible mild portal duodenopathy, no overt varices. Mucosal biopsies       done for histology.      Good hemostasis throughout. Impression:           - Z-line irregular. Biopsied.                       - Portal hypertensive  gastropathy. Biopsied. Recommendation:       -  Await pathology results.                       - Continue present medications. Procedure Code(s):    --- Professional ---                       [phone], Esophagogastroduodenoscopy, flexible, transoral;                        with biopsy, single or multiple Diagnosis Code(s):    --- Professional ---                       K22.8, Other specified diseases of esophagus                       K76.6, Portal hypertension                       K31.89, Other diseases of stomach and duodenum                       K74.60, Unspecified cirrhosis of liver CPT copyright 2016 American Medical Association. All rights reserved. The codes documented in this report are preliminary and upon coder review may  be revised to meet current compliance requirements. Lollie Sails, MD 07/06/2017 3:49:08 PM This report has been signed electronically. Number of Addenda: 0 Note Initiated On: 07/06/2017 3:21 PM      [facility]

## 2017-07-07 ENCOUNTER — Encounter: Payer: Self-pay | Admitting: Gastroenterology

## 2017-07-09 LAB — SURGICAL PATHOLOGY

## 2017-09-07 ENCOUNTER — Other Ambulatory Visit: Payer: Self-pay | Admitting: Gastroenterology

## 2017-09-07 DIAGNOSIS — K743 Primary biliary cirrhosis: Secondary | ICD-10-CM

## 2017-09-13 ENCOUNTER — Ambulatory Visit
Admission: RE | Admit: 2017-09-13 | Discharge: 2017-09-13 | Disposition: A | Discharge status: Home / Self Care | Payer: Medicare HMO | Source: Ambulatory Visit | Attending: Gastroenterology | Admitting: Gastroenterology

## 2017-09-13 DIAGNOSIS — R161 Splenomegaly, not elsewhere classified: Secondary | ICD-10-CM | POA: Diagnosis not present

## 2017-09-13 DIAGNOSIS — K743 Primary biliary cirrhosis: Secondary | ICD-10-CM | POA: Diagnosis not present

## 2017-09-13 DIAGNOSIS — N281 Cyst of kidney, acquired: Secondary | ICD-10-CM | POA: Diagnosis not present

## 2018-05-02 ENCOUNTER — Other Ambulatory Visit: Payer: Self-pay | Admitting: Gastroenterology

## 2018-05-02 DIAGNOSIS — K743 Primary biliary cirrhosis: Secondary | ICD-10-CM

## 2018-05-11 ENCOUNTER — Ambulatory Visit
Admission: RE | Admit: 2018-05-11 | Discharge: 2018-05-11 | Disposition: A | Discharge status: Home / Self Care | Payer: Medicare HMO | Source: Ambulatory Visit | Attending: Gastroenterology | Admitting: Gastroenterology

## 2018-05-11 DIAGNOSIS — R161 Splenomegaly, not elsewhere classified: Secondary | ICD-10-CM | POA: Diagnosis not present

## 2018-05-11 DIAGNOSIS — K743 Primary biliary cirrhosis: Secondary | ICD-10-CM | POA: Diagnosis not present

## 2018-08-31 ENCOUNTER — Other Ambulatory Visit: Payer: Self-pay | Admitting: Family Medicine

## 2018-08-31 DIAGNOSIS — Z1231 Encounter for screening mammogram for malignant neoplasm of breast: Secondary | ICD-10-CM

## 2018-09-28 ENCOUNTER — Ambulatory Visit
Admission: RE | Admit: 2018-09-28 | Discharge: 2018-09-28 | Disposition: A | Discharge status: Home / Self Care | Payer: Medicare HMO | Source: Ambulatory Visit | Attending: Family Medicine | Admitting: Family Medicine

## 2018-09-28 DIAGNOSIS — Z1231 Encounter for screening mammogram for malignant neoplasm of breast: Secondary | ICD-10-CM

## 2019-08-21 ENCOUNTER — Other Ambulatory Visit: Payer: Self-pay | Admitting: Family Medicine

## 2019-08-21 DIAGNOSIS — Z1231 Encounter for screening mammogram for malignant neoplasm of breast: Secondary | ICD-10-CM

## 2019-10-02 ENCOUNTER — Ambulatory Visit
Admission: RE | Admit: 2019-10-02 | Discharge: 2019-10-02 | Disposition: A | Discharge status: Home / Self Care | Payer: Medicare HMO | Source: Ambulatory Visit | Attending: Family Medicine | Admitting: Family Medicine

## 2019-10-02 DIAGNOSIS — Z1231 Encounter for screening mammogram for malignant neoplasm of breast: Secondary | ICD-10-CM | POA: Diagnosis not present

## 2020-08-28 ENCOUNTER — Other Ambulatory Visit: Payer: Self-pay | Admitting: Family Medicine

## 2020-08-28 DIAGNOSIS — Z1231 Encounter for screening mammogram for malignant neoplasm of breast: Secondary | ICD-10-CM

## 2020-09-20 ENCOUNTER — Other Ambulatory Visit: Payer: Self-pay | Admitting: Gastroenterology

## 2020-09-20 DIAGNOSIS — K745 Biliary cirrhosis, unspecified: Secondary | ICD-10-CM

## 2020-09-24 ENCOUNTER — Other Ambulatory Visit: Payer: Self-pay

## 2020-09-24 ENCOUNTER — Ambulatory Visit
Admission: RE | Admit: 2020-09-24 | Discharge: 2020-09-24 | Disposition: A | Discharge status: Home / Self Care | Payer: Medicare HMO | Source: Ambulatory Visit | Attending: Gastroenterology | Admitting: Gastroenterology

## 2020-09-24 DIAGNOSIS — K745 Biliary cirrhosis, unspecified: Secondary | ICD-10-CM | POA: Insufficient documentation

## 2020-09-26 ENCOUNTER — Other Ambulatory Visit: Payer: Self-pay

## 2020-09-26 ENCOUNTER — Other Ambulatory Visit
Admission: RE | Admit: 2020-09-26 | Discharge: 2020-09-26 | Disposition: A | Discharge status: Home / Self Care | Payer: Medicare HMO | Source: Ambulatory Visit | Attending: Gastroenterology | Admitting: Gastroenterology

## 2020-09-26 DIAGNOSIS — Z01812 Encounter for preprocedural laboratory examination: Secondary | ICD-10-CM | POA: Diagnosis present

## 2020-09-26 DIAGNOSIS — Z20822 Contact with and (suspected) exposure to covid-19: Secondary | ICD-10-CM | POA: Insufficient documentation

## 2020-09-26 LAB — SARS CORONAVIRUS 2 (TAT 6-24 HRS): SARS Coronavirus 2: NEGATIVE

## 2020-09-30 ENCOUNTER — Ambulatory Visit: Payer: Medicare HMO | Admitting: Anesthesiology

## 2020-09-30 ENCOUNTER — Ambulatory Visit
Admission: RE | Admit: 2020-09-30 | Discharge: 2020-09-30 | Disposition: A | Discharge status: Home / Self Care | Payer: Medicare HMO | Attending: Gastroenterology | Admitting: Gastroenterology

## 2020-09-30 ENCOUNTER — Encounter: Payer: Self-pay | Admitting: *Deleted

## 2020-09-30 ENCOUNTER — Encounter
Admission: RE | Disposition: A | Discharge status: Home / Self Care | Payer: Self-pay | Source: Home / Self Care | Attending: Gastroenterology

## 2020-09-30 ENCOUNTER — Other Ambulatory Visit: Payer: Self-pay

## 2020-09-30 DIAGNOSIS — K31819 Angiodysplasia of stomach and duodenum without bleeding: Secondary | ICD-10-CM | POA: Diagnosis present

## 2020-09-30 DIAGNOSIS — Z79890 Hormone replacement therapy (postmenopausal): Secondary | ICD-10-CM | POA: Diagnosis not present

## 2020-09-30 DIAGNOSIS — Z7982 Long term (current) use of aspirin: Secondary | ICD-10-CM | POA: Insufficient documentation

## 2020-09-30 DIAGNOSIS — Z79899 Other long term (current) drug therapy: Secondary | ICD-10-CM | POA: Diagnosis not present

## 2020-09-30 DIAGNOSIS — K2950 Unspecified chronic gastritis without bleeding: Secondary | ICD-10-CM | POA: Insufficient documentation

## 2020-09-30 DIAGNOSIS — K745 Biliary cirrhosis, unspecified: Secondary | ICD-10-CM | POA: Insufficient documentation

## 2020-09-30 HISTORY — PX: ESOPHAGOGASTRODUODENOSCOPY (EGD) WITH PROPOFOL: SHX5813

## 2020-09-30 SURGERY — ESOPHAGOGASTRODUODENOSCOPY (EGD) WITH PROPOFOL
Anesthesia: General

## 2020-09-30 MED ORDER — PROPOFOL 500 MG/50ML IV EMUL
INTRAVENOUS | Status: AC
  Filled 2020-09-30: qty 50

## 2020-09-30 MED ORDER — GLYCOPYRROLATE 0.2 MG/ML IJ SOLN
INTRAMUSCULAR | Status: DC | PRN
  Administered 2020-09-30: .2 mg via INTRAVENOUS

## 2020-09-30 MED ORDER — LIDOCAINE HCL (CARDIAC) PF 100 MG/5ML IV SOSY
PREFILLED_SYRINGE | INTRAVENOUS | Status: DC | PRN
  Administered 2020-09-30: 100 mg via INTRAVENOUS

## 2020-09-30 MED ORDER — SODIUM CHLORIDE 0.9 % IV SOLN
INTRAVENOUS | Status: DC
  Administered 2020-09-30: 09:00:00 1000 mL via INTRAVENOUS

## 2020-09-30 MED ORDER — PROPOFOL 10 MG/ML IV BOLUS
INTRAVENOUS | Status: AC
  Filled 2020-09-30: qty 20

## 2020-09-30 MED ORDER — PROPOFOL 10 MG/ML IV BOLUS
INTRAVENOUS | Status: DC | PRN
  Administered 2020-09-30: 180 ug/kg/min via INTRAVENOUS

## 2020-09-30 NOTE — H&P (Signed)
Outpatient short stay form Pre-procedure 09/30/2020 8:53 AM Raylene Miyamoto MD, MPH  Primary Physician: Dr. 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 No varices on EGD in 2018. No blood thinners. No family history of GI malignancies. No new GI symptoms.    Current Facility-Administered Medications:  .  0.9 %  sodium chloride infusion, , Intravenous, Continuous, Gerron Guidotti, Hilton Cork, MD  Medications Prior to Admission  Medication Sig Dispense Refill Last Dose  . amLODipine (NORVASC) 5 MG tablet Take 5 mg by mouth daily.   09/30/2020 at 0600  . aspirin 81 MG tablet Take 81 mg by mouth daily.   09/29/2020 at Unknown time  . carvedilol (COREG) 12.5 MG tablet Take 12.5 mg by mouth 2 (two) times daily.   09/30/2020 at 0600  . Cholecalciferol (VITAMIN D3) 5000 UNITS CAPS Take 1 capsule by mouth daily.   09/29/2020 at Unknown time  . Cyanocobalamin (VITAMIN B-12) 2500 MCG SUBL Place 1 tablet under the tongue daily.   09/29/2020 at Unknown time  . Garlic 9562 MG CAPS Take 1 capsule by mouth daily.   09/29/2020 at Unknown time  . hydrochlorothiazide (HYDRODIURIL) 25 MG tablet Take 25 mg by mouth daily.   09/29/2020 at Unknown time  . levothyroxine (SYNTHROID, LEVOTHROID) 150 MCG tablet Take 150 mcg by mouth daily.   09/30/2020 at 0600  . pantoprazole (PROTONIX) 40 MG tablet Take 40 mg by mouth daily.   09/29/2020 at Unknown time  . Ursodiol (URSO 250 PO) Take 1 tablet by mouth 2 (two) times daily.   Past Week at Unknown time  . albuterol (PROVENTIL HFA;VENTOLIN HFA) 108 (90 Base) MCG/ACT inhaler Inhale 2 puffs into the lungs every 4 (four) hours as needed for wheezing or shortness of breath. 1 Inhaler 0   . azithromycin (ZITHROMAX Z-PAK) 250 MG tablet Take 1 tablet (250 mg total) by mouth daily. (Patient not taking: Reported on 07/06/2017) 4 each 0   . chlorpheniramine-HYDROcodone (TUSSIONEX PENNKINETIC  ER) 10-8 MG/5ML SUER Take 5 mLs by mouth every 12 (twelve) hours as needed. (Patient not taking: Reported on 07/06/2017) 115 mL 0   . HYDROcodone-acetaminophen (NORCO) 5-325 MG per tablet Take 1-2 tablets by mouth every 4 (four) hours as needed for moderate pain. (Patient not taking: Reported on 01/15/2017) 12 tablet 0   . quinapril (ACCUPRIL) 40 MG tablet Take 40 mg by mouth daily. (Patient not taking: Reported on 09/30/2020)   Not Taking at Unknown time     No Known Allergies   Past Medical History:  Diagnosis Date  . GERD (gastroesophageal reflux disease)   . Hypertension   . Hypothyroidism   . Primary biliary cirrhosis (HCC)     Review of systems:  Otherwise negative.    Physical Exam  Gen: Alert, oriented. Appears stated age.  HEENT:  PERRLA. Lungs: No respiratory distress CV: RRR Abd: soft, benign, no masses Ext: No edema    Planned procedures: Proceed with EGD. The patient understands the nature of the planned procedure, indications, risks, alternatives and potential complications including but not limited to bleeding, infection, perforation, damage to internal organs and possible oversedation/side effects from anesthesia. The patient agrees and gives consent to proceed.  Please refer to procedure notes for findings, recommendations and patient disposition/instructions.     Raylene Miyamoto MD, MPH Gastroenterology 09/30/2020  8:53 AM

## 2020-09-30 NOTE — Transfer of Care (Signed)
Immediate Anesthesia Transfer of Care Note  Patient: Kristine Fischer  Procedure(s) Performed: ESOPHAGOGASTRODUODENOSCOPY (EGD) WITH PROPOFOL (N/A )  Patient Location: PACU  Anesthesia Type:General  Level of Consciousness: awake and alert   Airway & Oxygen Therapy: Patient Spontanous Breathing  Post-op Assessment: Report given to RN and Post -op Vital signs reviewed and stable  Post vital signs: Reviewed and stable  Last Vitals:  Vitals Value Taken Time  BP 102/63 09/30/20 0932  Temp 35.6 C 09/30/20 0932  Pulse 57 09/30/20 0936  Resp 14 09/30/20 0936  SpO2 93 % 09/30/20 0936  Vitals shown include unvalidated device data.  Last Pain:  Vitals:   09/30/20 0843  TempSrc: Temporal  PainSc: 0-No pain         Complications: No complications documented.

## 2020-09-30 NOTE — Anesthesia Postprocedure Evaluation (Signed)
Anesthesia Post Note  Patient: Kristine Fischer  Procedure(s) Performed: ESOPHAGOGASTRODUODENOSCOPY (EGD) WITH PROPOFOL (N/A )  Patient location during evaluation: Endoscopy Anesthesia Type: General Level of consciousness: awake and alert Pain management: pain level controlled Vital Signs Assessment: post-procedure vital signs reviewed and stable Respiratory status: spontaneous breathing, nonlabored ventilation, respiratory function stable and patient connected to nasal cannula oxygen Cardiovascular status: blood pressure returned to baseline and stable Postop Assessment: no apparent nausea or vomiting Anesthetic complications: no   No complications documented.   Last Vitals:  Vitals:   09/30/20 0932 09/30/20 1001  BP: 102/63 126/75  Pulse:    Resp:    Temp: (!) 35.6 C   SpO2:      Last Pain:  Vitals:   09/30/20 0952  TempSrc:   PainSc: 0-No pain                 Arita Miss

## 2020-09-30 NOTE — Op Note (Signed)
[facility] Gastroenterology Patient Name: Kristine Fischer Procedure Date: 09/30/2020 8:53 AM MRN: [id number] Account #: [id number] Date of Birth: 1951/07/28 Admit Type: Outpatient Age: 69 Room: [facility] ENDO ROOM 3 Gender: Female Note Status: Finalized Procedure:             Upper GI endoscopy Indications:           Advanced Fibrosis Providers:             Andrey Farmer MD, MD Referring MD:          Marguerita Merles, MD (Referring MD) Medicines:             Monitored Anesthesia Care Complications:         No immediate complications. Estimated blood loss:                         Minimal. Procedure:             Pre-Anesthesia Assessment:                        - Prior to the procedure, a History and Physical was                         performed, and patient medications and allergies were                         reviewed. The patient is competent. The risks and                         benefits of the procedure and the sedation options and                         risks were discussed with the patient. All questions                         were answered and informed consent was obtained.                         Patient identification and proposed procedure were                         verified by the physician, the nurse, the anesthetist                         and the technician in the endoscopy suite. Mental                         Status Examination: alert and oriented. Airway                         Examination: normal oropharyngeal airway and neck                         mobility. Respiratory Examination: clear to                         auscultation. CV Examination: normal. Prophylactic  Antibiotics: The patient does not require prophylactic                         antibiotics. Prior Anticoagulants: The patient has                         taken no previous anticoagulant or antiplatelet                         agents. ASA Grade Assessment: III -  A patient with                         severe systemic disease. After reviewing the risks and                         benefits, the patient was deemed in satisfactory                         condition to undergo the procedure. The anesthesia                         plan was to use monitored anesthesia care (MAC).                         Immediately prior to administration of medications,                         the patient was re-assessed for adequacy to receive                         sedatives. The heart rate, respiratory rate, oxygen                         saturations, blood pressure, adequacy of pulmonary                         ventilation, and response to care were monitored                         throughout the procedure. The physical status of the                         patient was re-assessed after the procedure.                        After obtaining informed consent, the endoscope was                         passed under direct vision. Throughout the procedure,                         the patient's blood pressure, pulse, and oxygen                         saturations were monitored continuously. The Endoscope                         was introduced through the mouth, and advanced to the  second part of duodenum. The upper GI endoscopy was                         accomplished without difficulty. The patient tolerated                         the procedure well. Findings:      There is no endoscopic evidence of varices in the entire esophagus.      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       Biopsies were taken with a cold forceps for histology. Estimated blood       loss was minimal.      The examined duodenum was normal. Impression:            - Gastric antral vascular ectasia. Biopsied.                        - Normal examined duodenum. Recommendation:        - Discharge patient to home.                        - Resume  previous diet.                        - Continue present medications.                        - Await pathology results.                        - Return to referring physician as previously                         scheduled.                        - Repeat upper endoscopy in 3 years for screening                         purposes. Procedure Code(s):     --- Professional ---                        [phone], Esophagogastroduodenoscopy, flexible,                         transoral; with biopsy, single or multiple Diagnosis Code(s):     --- Professional ---                        K31.819, Angiodysplasia of stomach and duodenum                         without bleeding CPT copyright 2019 American Medical Association. All rights reserved. The codes documented in this report are preliminary and upon coder review may  be revised to meet current compliance requirements. Andrey Farmer, MD Andrey Farmer MD, MD 09/30/2020 9:23:45 AM Number of Addenda: 0 Note Initiated On: 09/30/2020 8:53 AM Estimated Blood Loss:  Estimated blood loss was minimal.      [facility]

## 2020-09-30 NOTE — Interval H&P Note (Signed)
History and Physical Interval Note:  09/30/2020 8:55 AM  Kristine Fischer  has presented today for surgery, with the diagnosis of BILIARY CIRRHOSIS.  The various methods of treatment have been discussed with the patient and family. After consideration of risks, benefits and other options for treatment, the patient has consented to  Procedure(s): ESOPHAGOGASTRODUODENOSCOPY (EGD) WITH PROPOFOL (N/A) as a surgical intervention.  The patient's history has been reviewed, patient examined, no change in status, stable for surgery.  I have reviewed the patient's chart and labs.  Questions were answered to the patient's satisfaction.     Lesly Rubenstein  Ok to proceed with EGD

## 2020-09-30 NOTE — Anesthesia Preprocedure Evaluation (Signed)
Anesthesia Evaluation  Patient identified by MRN, date of birth, ID band Patient awake    Reviewed: Allergy & Precautions, NPO status , Patient's Chart, lab work & pertinent test results, reviewed documented beta blocker date and time   History of Anesthesia Complications Negative for: history of anesthetic complications  Airway Mallampati: II  TM Distance: >3 FB Neck ROM: Full    Dental no notable dental hx. (+) Teeth Intact   Pulmonary neg sleep apnea, neg COPD, Patient abstained from smoking.Not current smoker,    Pulmonary exam normal breath sounds clear to auscultation       Cardiovascular Exercise Tolerance: Good METShypertension, Pt. on medications and Pt. on home beta blockers (-) CAD and (-) Past MI Normal cardiovascular exam(-) dysrhythmias  Rhythm:Regular Rate:Normal - Systolic murmurs    Neuro/Psych negative neurological ROS  negative psych ROS   GI/Hepatic GERD  Medicated and Controlled,(+) Cirrhosis     (-) substance abuse  ,   Endo/Other  neg diabetesHypothyroidism Morbid obesity  Renal/GU negative Renal ROS  negative genitourinary   Musculoskeletal negative musculoskeletal ROS (+)   Abdominal Normal abdominal exam  (+)   Peds negative pediatric ROS (+)  Hematology negative hematology ROS (+)   Anesthesia Other Findings Past Medical History: No date: GERD (gastroesophageal reflux disease) No date: Hypertension No date: Hypothyroidism No date: Primary biliary cirrhosis (HCC)  Reproductive/Obstetrics                             Anesthesia Physical  Anesthesia Plan  ASA: III  Anesthesia Plan: General   Post-op Pain Management:    Induction: Intravenous  PONV Risk Score and Plan: 3 and Ondansetron, Propofol infusion and TIVA  Airway Management Planned: Nasal Cannula  Additional Equipment: None  Intra-op Plan:   Post-operative Plan:   Informed Consent: I  have reviewed the patients History and Physical, chart, labs and discussed the procedure including the risks, benefits and alternatives for the proposed anesthesia with the patient or authorized representative who has indicated his/her understanding and acceptance.     Dental advisory given  Plan Discussed with: CRNA and Surgeon  Anesthesia Plan Comments: (Discussed risks of anesthesia with patient, including possibility of difficulty with spontaneous ventilation under anesthesia necessitating airway intervention, PONV, and rare risks such as cardiac or respiratory or neurological events. Patient understands.)        Anesthesia Quick Evaluation

## 2020-10-01 ENCOUNTER — Encounter: Payer: Self-pay | Admitting: Gastroenterology

## 2020-10-01 LAB — SURGICAL PATHOLOGY

## 2020-10-07 ENCOUNTER — Other Ambulatory Visit: Payer: Self-pay

## 2020-10-07 ENCOUNTER — Ambulatory Visit
Admission: RE | Admit: 2020-10-07 | Discharge: 2020-10-07 | Disposition: A | Discharge status: Home / Self Care | Payer: Medicare HMO | Source: Ambulatory Visit | Attending: Family Medicine | Admitting: Family Medicine

## 2020-10-07 DIAGNOSIS — Z1231 Encounter for screening mammogram for malignant neoplasm of breast: Secondary | ICD-10-CM | POA: Insufficient documentation

## 2021-04-17 ENCOUNTER — Other Ambulatory Visit: Payer: Self-pay | Admitting: Gastroenterology

## 2021-04-17 DIAGNOSIS — K743 Primary biliary cirrhosis: Secondary | ICD-10-CM

## 2021-04-22 ENCOUNTER — Other Ambulatory Visit: Payer: Self-pay

## 2021-04-22 ENCOUNTER — Ambulatory Visit
Admission: RE | Admit: 2021-04-22 | Discharge: 2021-04-22 | Disposition: A | Discharge status: Home / Self Care | Payer: Medicare HMO | Source: Ambulatory Visit | Attending: Gastroenterology | Admitting: Gastroenterology

## 2021-04-22 DIAGNOSIS — K743 Primary biliary cirrhosis: Secondary | ICD-10-CM | POA: Insufficient documentation

## 2021-10-21 ENCOUNTER — Other Ambulatory Visit: Payer: Self-pay | Admitting: Gastroenterology

## 2021-10-21 DIAGNOSIS — K743 Primary biliary cirrhosis: Secondary | ICD-10-CM

## 2021-10-24 ENCOUNTER — Other Ambulatory Visit: Payer: Self-pay

## 2021-10-24 ENCOUNTER — Ambulatory Visit
Admission: RE | Admit: 2021-10-24 | Discharge: 2021-10-24 | Disposition: A | Discharge status: Home / Self Care | Payer: Medicare HMO | Source: Ambulatory Visit | Attending: Gastroenterology | Admitting: Gastroenterology

## 2021-10-24 DIAGNOSIS — K743 Primary biliary cirrhosis: Secondary | ICD-10-CM | POA: Insufficient documentation

## 2022-03-02 IMAGING — US US ABDOMEN COMPLETE
1 series · 14 of 25 positions shown · non-contrast
Comparison: 05/11/2018

CLINICAL DATA: Biliary cirrhosis.

EXAM:
ABDOMEN ULTRASOUND COMPLETE

[Series 1: us abdomen complete · 0.25mm/px · 14 of 105 slices shown]
[im 1/105]
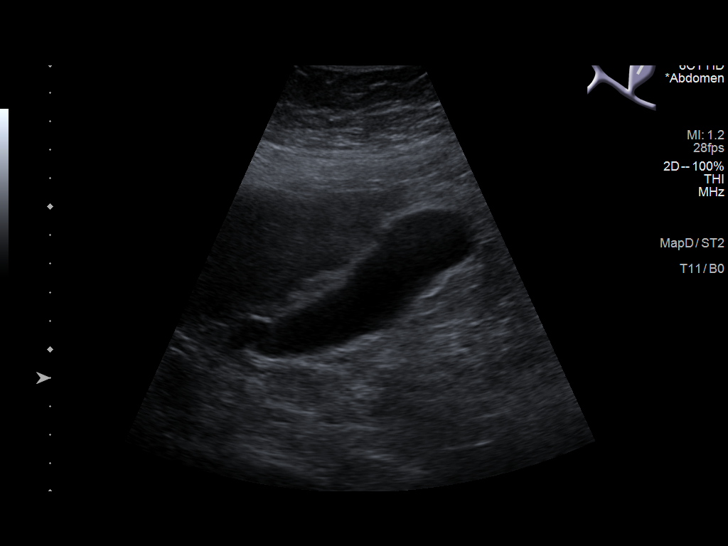
[im 9/105]
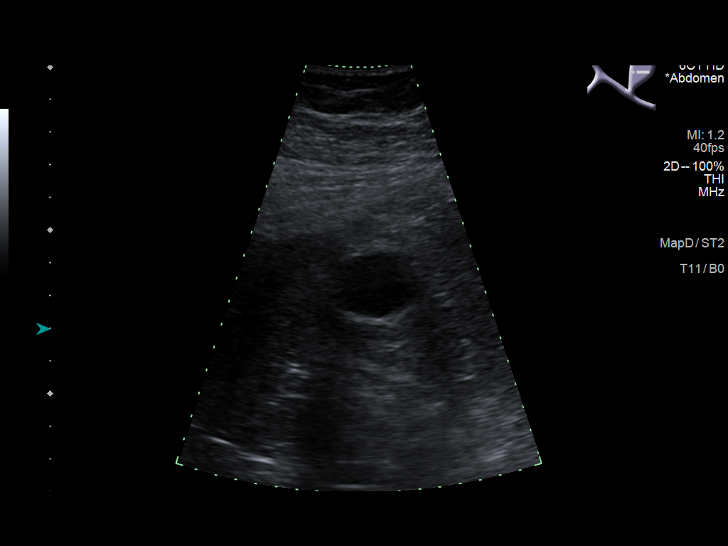
[im 18/105]
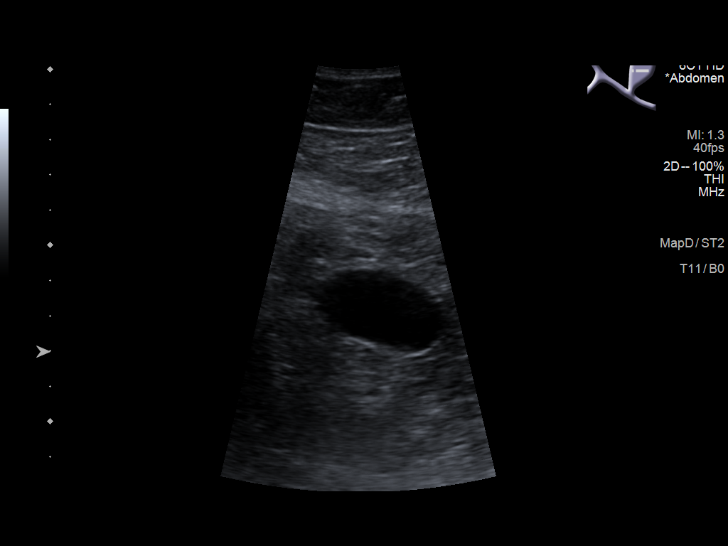
[im 27/105]
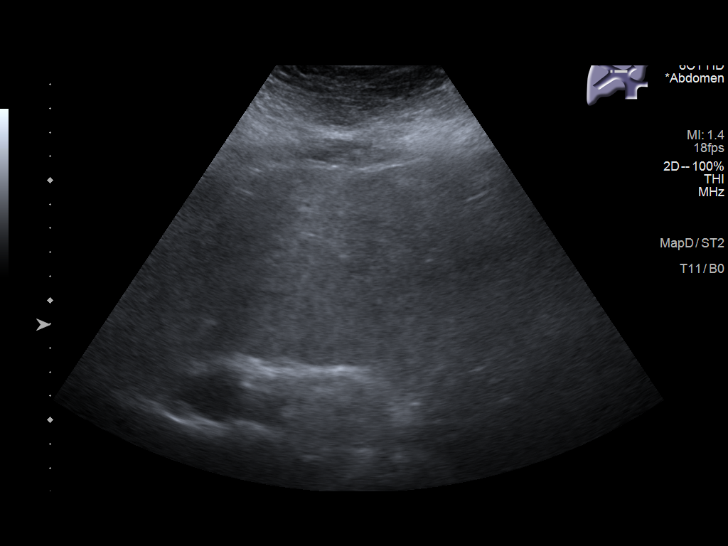
[im 35/105]
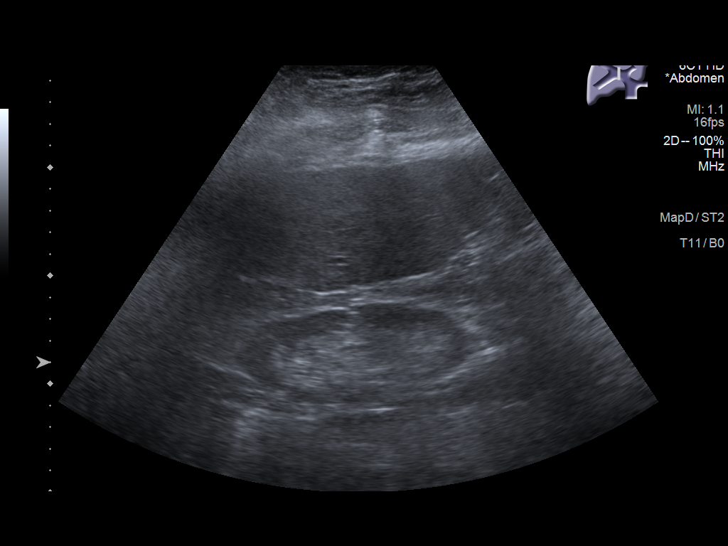
[im 40/105]
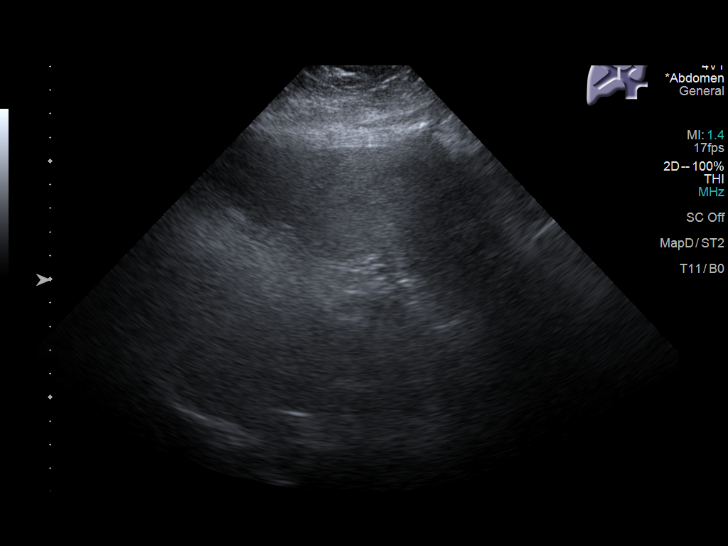
[im 48/105]
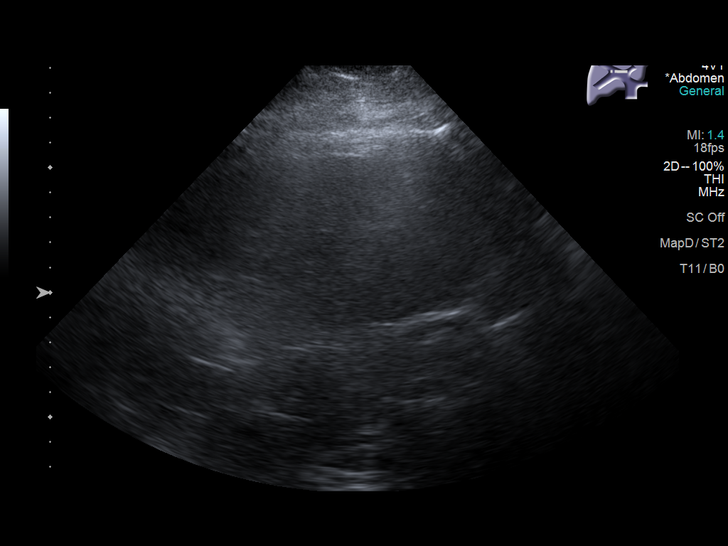
[im 57/105]
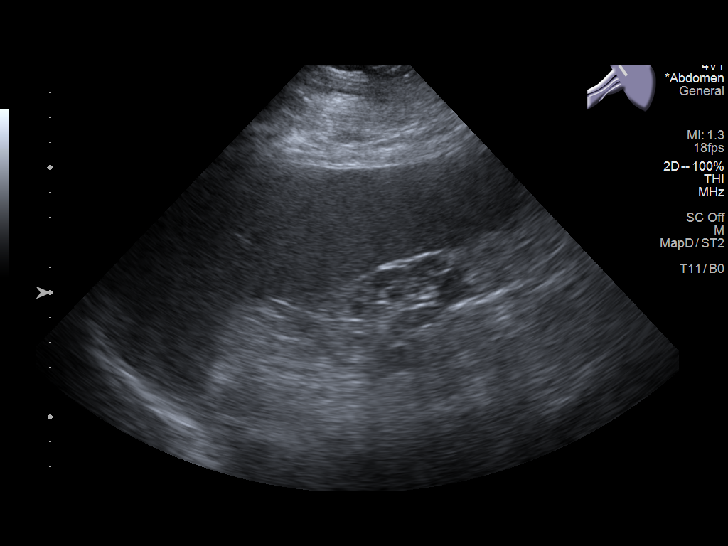
[im 66/105]
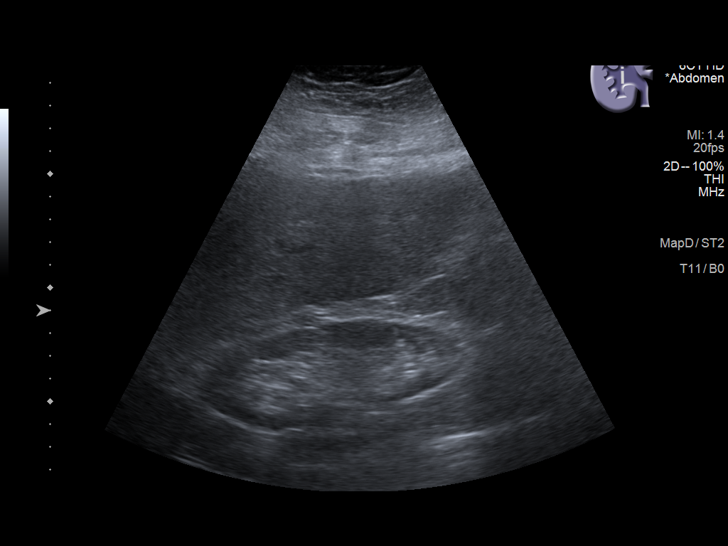
[im 70/105]
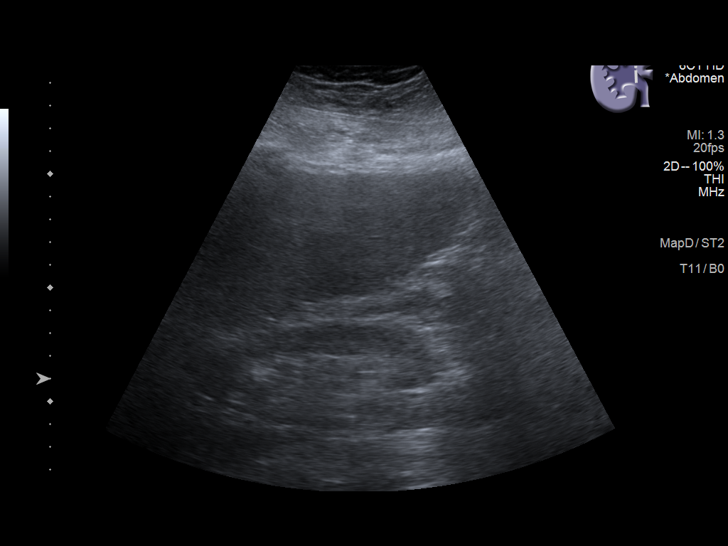
[im 79/105]
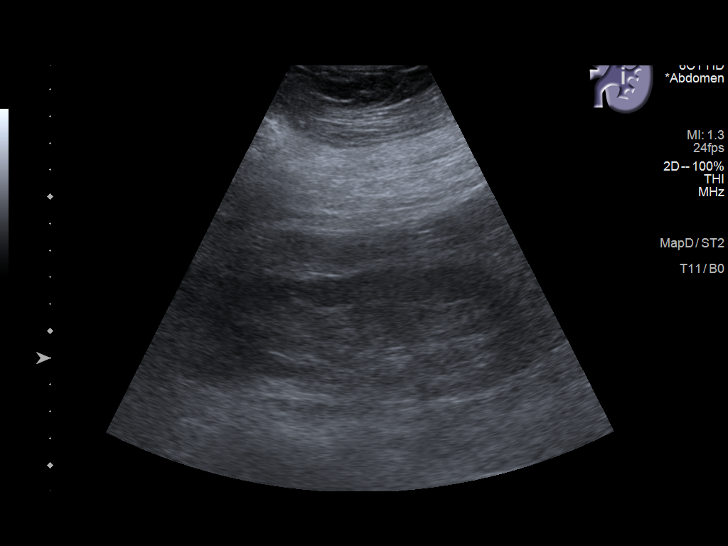
[im 87/105]
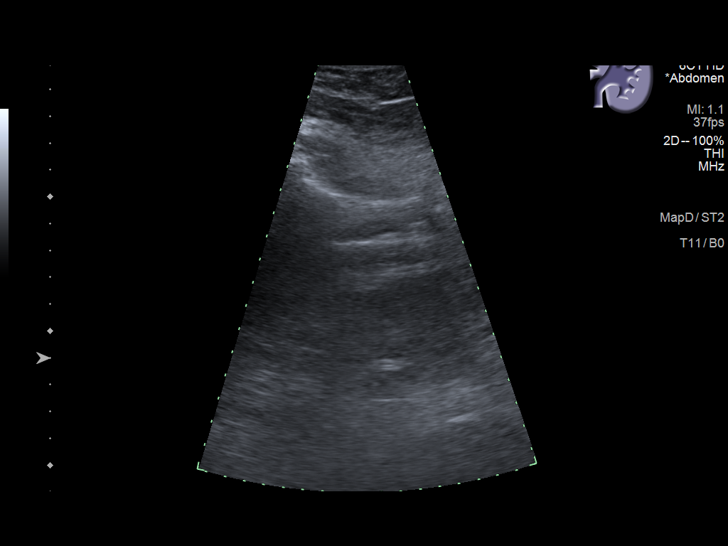
[im 96/105]
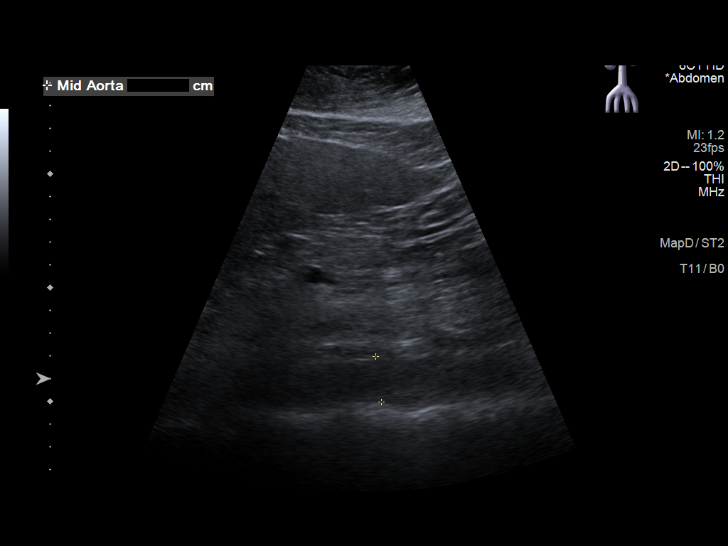
[im 105/105]
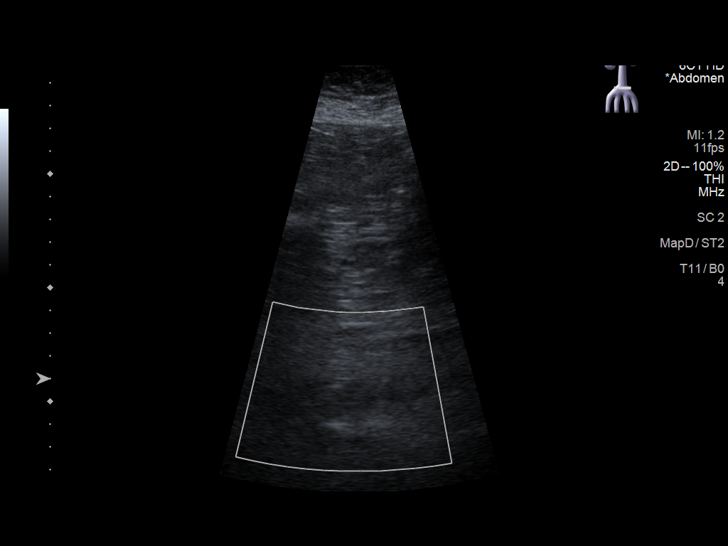

[14 of 25 positions shown; findings below may reference images not displayed]

FINDINGS: Gallbladder: 5 mm stone. No gallbladder wall thickening. No
sonographic Murphy sign noted by sonographer.

Common bile duct: Diameter: 4 mm

Liver: Heterogeneously hyperechoic, dense appearance of the liver
diffusely with a nodular liver contour. No mass identified. Portal
vein is patent on color Doppler imaging with normal direction of
blood flow towards the liver.

IVC: No abnormality visualized.

Pancreas: Partially obscured by bowel gas. Visualized portion
unremarkable.

Spleen: Enlarged with a length of 16.6 cm and estimated volume of
950 cc, larger than on the prior study.

Right Kidney: Length: 12.0 cm. Echogenicity within normal limits. No
mass or hydronephrosis visualized.

Left Kidney: Length: 12.5 cm. Echogenicity within normal limits. No
mass or hydronephrosis visualized.

Abdominal aorta: No aneurysm visualized.

Other findings: None.
IMPRESSION: 1. Cirrhosis without a liver mass identified.
2. Cholelithiasis.
3. Increased splenomegaly.

## 2022-04-07 ENCOUNTER — Ambulatory Visit: Payer: Medicare HMO | Admitting: Dermatology

## 2022-04-07 DIAGNOSIS — L720 Epidermal cyst: Secondary | ICD-10-CM

## 2022-04-07 DIAGNOSIS — R21 Rash and other nonspecific skin eruption: Secondary | ICD-10-CM | POA: Diagnosis not present

## 2022-04-07 MED ORDER — DOXYCYCLINE MONOHYDRATE 100 MG PO CAPS: 100.0000 mg | ORAL_CAPSULE | Freq: Two times a day (BID) | ORAL | 0 refills | Status: AC

## 2022-04-07 MED ORDER — MUPIROCIN 2 % EX OINT: 1.0000 | TOPICAL_OINTMENT | Freq: Two times a day (BID) | CUTANEOUS | 0 refills | Status: AC

## 2022-04-07 NOTE — Progress Notes (Signed)
New Patient Visit  Subjective  Kristine Fischer is a 71 y.o. female who presents for the following: New Patient (Initial Visit) (Here concerning a rash that started at hand, arms, chest, and knees in April. Has tried clobetasol and hydrocortisone and did not help, patient has active bumps at arms, hands, b/l knees, and chest today. Denies any itching or pain. Patient also reports a bump at left buttock that drains and will not go away. ).   The following portions of the chart were reviewed this encounter and updated as appropriate:       Review of Systems:  No other skin or systemic complaints except as noted in HPI or Assessment and Plan.  Objective  Well appearing patient in no apparent distress; mood and affect are within normal limits.  A focused examination was performed including b/l arm, b/l hands, b/l legs, back, chest, left medial buttock . Relevant physical exam findings are noted in the Assessment and Plan.  Left Forearm, right forearm Pink scaly flat papules and macules on b/l extensor forearms, hand dorsum , left knee > right knee,  mid upper chest, photodistributed. 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 Avoid applying to face, groin, and axilla. Use as directed. Long-term use can cause thinning of the skin.  Topical steroids (such as triamcinolone, fluocinolone, fluocinonide, mometasone, clobetasol, halobetasol, betamethasone, hydrocortisone) can cause thinning and lightening of the skin if they are used for too long in the same area. Your physician has selected the right strength medicine for your problem and area  affected on the body. Please use your medication only as directed by your physician to prevent side effects.    Skin / nail biopsy - Left Forearm Type of biopsy: punch   Informed consent: discussed and consent obtained   Patient was prepped and draped in usual sterile fashion: Area prepped with alcohol. Anesthesia: the lesion was anesthetized in a standard fashion   Anesthetic:  1% lidocaine w/ epinephrine 1-100,000 buffered w/ 8.4% NaHCO3 Punch size:  3.5 mm Suture size:  4-0 Suture type: nylon   Hemostasis achieved with: suture and pressure   Outcome: patient tolerated procedure well   Post-procedure details: wound care instructions given   Post-procedure details comment:  Ointment and small bandage applied.   Skin / nail biopsy - right forearm Type of biopsy: punch   Informed consent: discussed and consent obtained   Patient was prepped and draped in usual sterile fashion: Area prepped with alcohol. 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  Since 4/23,  not itchy.    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  Since 4/23, not itchy.    Check Margins: No  Epidermal inclusion cyst left medial buttock  Inflamed   Start Doxycycline 100 mg capsule - take  1 capsule po bid with food for 2 weeks, after 2 weeks take 1 cap po qd until finished with rx.   Can do warm compresses daily when flared to facilitate drainage.   Start mupirocin ointment - apply to aa qd and cover until healed  When flared can start clobetasol ointment to aa, use for up to 2 weeks. Avoid applying to face, groin, and axilla. Use as directed. Long-term use can cause thinning of the skin.  If not improving, may need consult with general surgeon for removal  Doxycycline should be taken with food to prevent nausea. Do not lay down for 30 minutes after taking. Be cautious with sun exposure and use good sun protection while on this medication. Pregnant women should not take this medication.   Topical steroids (such as triamcinolone, fluocinolone, fluocinonide, mometasone, clobetasol, halobetasol, betamethasone, hydrocortisone) can cause thinning and lightening of the skin if they are used for too long in the same area. Your physician has selected the right strength medicine for your problem and area affected on the body. Please use your medication only as directed by your physician to prevent side effects.   Benign-appearing. Exam most consistent with an epidermal inclusion cyst. Discussed that a cyst is a benign growth that can grow over time and sometimes get irritated or inflamed. Recommend observation if it is not bothersome. Discussed option of surgical excision to remove it if it is growing, symptomatic, or other changes noted. Please call for new or changing lesions so they can be evaluated.  doxycycline (MONODOX) 100 MG capsule - left medial buttock Take 1 capsule (100 mg total) by mouth 2 (two) times daily. Take with food and drink.  mupirocin ointment (BACTROBAN) 2 % - left medial buttock Apply 1  Application topically 2 (two) times daily. To aa of buttock and any open wounds until healed.   Return for 1 week suture removal and rash follow up.   I, Ruthell Rummage, CMA, am acting as scribe for Brendolyn Patty, MD.  Documentation: I have reviewed the above documentation for accuracy and completeness, and I agree with the above.  Brendolyn Patty MD

## 2022-04-07 NOTE — Patient Instructions (Addendum)
For rash at arm , hands, chest and knees  Restart Clobetasol cream to affected areas twice daily for 1 week until follow up. Avoid face groin and axilla Topical steroids (such as triamcinolone, fluocinolone, fluocinonide, mometasone, clobetasol, halobetasol, betamethasone, hydrocortisone) can cause thinning and lightening of the skin if they are used for too long in the same area. Your physician has selected the right strength medicine for your problem and area affected on the body. Please use your medication only as directed by your physician to prevent side effects.   Apply mupirocin twice daily to any open wounds until healed.    For Cyst at left medial buttock   Start Mupirocin ointment - apply topically to areas twice daily  Start doxycycline 100 mg capsule - take 1 capsules by mouth twice daily for 2 weeks, after 2 weeks take 1 capsule by mouth daily until end of prescription.   Doxycycline should be taken with food to prevent nausea. Do not lay down for 30 minutes after taking. Be cautious with sun exposure and use good sun protection while on this medication. Pregnant women should not take this medication.      Biopsy Wound Care Instructions  Leave the original bandage on for 24 hours if possible.  If the bandage becomes soaked or soiled before that time, it is OK to remove it and examine the wound.  A small amount of post-operative bleeding is normal.  If excessive bleeding occurs, remove the bandage, place gauze over the site and apply continuous pressure (no peeking) over the area for 30 minutes. If this does not work, please call our clinic as soon as possible or page your doctor if it is after hours.   Once a day, cleanse the wound with soap and water. It is fine to shower. If a thick crust develops you may use a Q-tip dipped into dilute hydrogen peroxide (mix 1:1 with water) to dissolve it.  Hydrogen peroxide can slow the healing process, so use it only as needed.    After  washing, apply petroleum jelly (Vaseline) or an antibiotic ointment if your doctor prescribed one for you, followed by a bandage.    For best healing, the wound should be covered with a layer of ointment at all times. If you are not able to keep the area covered with a bandage to hold the ointment in place, this may mean re-applying the ointment several times a day.  Continue this wound care until the wound has healed and is no longer open.   Itching and mild discomfort is normal during the healing process. However, if you develop pain or severe itching, please call our office.   If you have any discomfort, you can take Tylenol (acetaminophen) or ibuprofen as directed on the bottle. (Please do not take these if you have an allergy to them or cannot take them for another reason).  Some redness, tenderness and white or yellow material in the wound is normal healing.  If the area becomes very sore and red, or develops a thick yellow-green material (pus), it may be infected; please notify us.    If you have stitches, return to clinic as directed to have the stitches removed. You will continue wound care for 2-3 days after the stitches are removed.   Wound healing continues for up to one year following surgery. It is not unusual to experience pain in the scar from time to time during the interval.  If the pain becomes severe or the  scar thickens, you should notify the office.    A slight amount of redness in a scar is expected for the first six months.  After six months, the redness will fade and the scar will soften and fade.  The color difference becomes less noticeable with time.  If there are any problems, return for a post-op surgery check at your earliest convenience.  To improve the appearance of the scar, you can use silicone scar gel, cream, or sheets (such as Mederma or Serica) every night for up to one year. These are available over the counter (without a prescription).  Please call our  office at [phone] for any questions or concerns.    Due to recent changes in healthcare laws, you may see results of your pathology and/or laboratory studies on MyChart before the doctors have had a chance to review them. We understand that in some cases there may be results that are confusing or concerning to you. Please understand that not all results are received at the same time and often the doctors may need to interpret multiple results in order to provide you with the best plan of care or course of treatment. Therefore, we ask that you please give [place] 2 business days to thoroughly review all your results before contacting the office for clarification. Should we see a critical lab result, you will be contacted sooner.   If You Need Anything After Your Visit  If you have any questions or concerns for your doctor, please call our main line at [phone] and press option 4 to reach your doctor's medical assistant. If no one answers, please leave a voicemail as directed and we will return your call as soon as possible. Messages left after 4 pm will be answered the following business day.   You may also send [place] a message via [place]. We typically respond to MyChart messages within 1-2 business days.  For prescription refills, please ask your pharmacy to contact our office. Our fax number is [phone].  If you have an urgent issue when the clinic is closed that cannot wait until the next business day, you can page your doctor at the number below.    Please note that while we do our best to be available for urgent issues outside of office hours, we are not available 24/7.   If you have an urgent issue and are unable to reach [place], you may choose to seek medical care at your doctor's office, retail clinic, urgent care center, or emergency room.  If you have a medical emergency, please immediately call 911 or go to the emergency department.  Pager Numbers  - Dr. Nehemiah Massed:  [phone]  - Dr. Laurence Ferrari: [phone]  - Dr. Nicole Kindred: [phone]  In the event of inclement weather, please call our main line at [phone] for an update on the status of any delays or closures.  Dermatology Medication Tips: Please keep the boxes that topical medications come in in order to help keep track of the instructions about where and how to use these. Pharmacies typically print the medication instructions only on the boxes and not directly on the medication tubes.   If your medication is too expensive, please contact our office at [phone] option 4 or send [place] a message through [place].   We are unable to tell what your co-pay for medications will be in advance as this is different depending on your insurance coverage. However, we may be able to find a substitute medication at lower cost  or fill out paperwork to get insurance to cover a needed medication.   If a prior authorization is required to get your medication covered by your insurance company, please allow [place] 1-2 business days to complete this process.  Drug prices often vary depending on where the prescription is filled and some pharmacies may offer cheaper prices.  The website www.goodrx.com contains coupons for medications through different pharmacies. The prices here do not account for what the cost may be with help from insurance (it may be cheaper with your insurance), but the website can give you the price if you did not use any insurance.  - You can print the associated coupon and take it with your prescription to the pharmacy.  - You may also stop by our office during regular business hours and pick up a GoodRx coupon card.  - If you need your prescription sent electronically to a different pharmacy, notify our office through [facility] or by phone at [phone] option 4.     Si Usted Necesita Algo Despus de Su Visita  Tambin puede enviarnos un mensaje a travs de Pharmacist, community. Por lo general  respondemos a los mensajes de MyChart en el transcurso de 1 a 2 das hbiles.  Para renovar recetas, por favor pida a su farmacia que se ponga en contacto con nuestra oficina. Harland Dingwall de fax es [place] [phone].  Si tiene un asunto urgente cuando la clnica est cerrada y que no puede esperar hasta el siguiente da hbil, puede llamar/localizar a su doctor(a) al nmero que aparece a continuacin.   Por favor, tenga en cuenta que aunque hacemos todo lo posible para estar disponibles para asuntos urgentes fuera del horario de [place], no estamos disponibles las 24 horas del da, los 7 das de la [place].   Si tiene un problema urgente y no puede comunicarse con nosotros, puede optar por buscar atencin mdica  en el consultorio de su doctor(a), en una clnica privada, en un centro de atencin urgente o en una sala de emergencias.  Si tiene Engineering geologist, por favor llame inmediatamente al 911 o vaya a la sala de emergencias.  Nmeros de bper  - Dr. Nehemiah Massed: [phone]  - Dra. Moye: [phone]  - Dra. Nicole Kindred: [phone]  En caso de inclemencias del [place], por favor llame a Johnsie Kindred principal al [phone] para una actualizacin sobre el [place] de cualquier retraso o cierre.  Consejos para la medicacin en dermatologa: Por favor, guarde las cajas en las que vienen los medicamentos de uso tpico para ayudarle a seguir las instrucciones sobre dnde y cmo usarlos. Las farmacias generalmente imprimen las instrucciones del medicamento slo en las cajas y no directamente en los tubos del [place].   Si su medicamento es muy caro, por favor, pngase en contacto con Zigmund Daniel llamando al [phone] y presione la opcin 4 o envenos un mensaje a travs de Pharmacist, community.   No podemos decirle cul ser su copago por los medicamentos por adelantado ya que esto es diferente dependiendo de la cobertura de su seguro. Sin embargo, es posible que podamos encontrar un  medicamento sustituto a Electrical engineer un formulario para que el seguro cubra el medicamento que se considera necesario.   Si se requiere una autorizacin previa para que su compaa de seguros [place] su medicamento, por favor permtanos de 1 a 2 das hbiles para completar este proceso.  Los precios de los medicamentos varan con frecuencia dependiendo del Environmental consultant de dnde se surte la  receta y alguna farmacias pueden ofrecer precios ms baratos.  El sitio web www.goodrx.com tiene cupones para medicamentos de Airline pilot. Los precios aqu no tienen en cuenta lo que podra costar con la ayuda del seguro (puede ser ms barato con su seguro), pero el sitio web puede darle el precio si no utiliz Research scientist (physical sciences).  - Puede imprimir el cupn correspondiente y llevarlo con su receta a la farmacia.  - Tambin puede pasar por nuestra oficina durante el horario de atencin regular y Charity fundraiser una tarjeta de cupones de GoodRx.  - Si necesita que su receta se enve electrnicamente a una farmacia diferente, informe a nuestra oficina a travs de MyChart de [place] o por telfono llamando al [phone] y presione la opcin 4.

## 2022-04-08 ENCOUNTER — Other Ambulatory Visit: Payer: Self-pay | Admitting: Family Medicine

## 2022-04-08 DIAGNOSIS — Z1231 Encounter for screening mammogram for malignant neoplasm of breast: Secondary | ICD-10-CM

## 2022-04-14 ENCOUNTER — Ambulatory Visit: Payer: Medicare HMO | Admitting: Dermatology

## 2022-04-14 DIAGNOSIS — R21 Rash and other nonspecific skin eruption: Secondary | ICD-10-CM

## 2022-04-14 DIAGNOSIS — L720 Epidermal cyst: Secondary | ICD-10-CM | POA: Diagnosis not present

## 2022-04-14 MED ORDER — CLOBETASOL PROPIONATE 0.05 % EX CREA: TOPICAL_CREAM | CUTANEOUS | 1 refills | Status: AC

## 2022-04-17 LAB — ANA W/REFLEX: ANA Titer 1: NEGATIVE

## 2022-04-22 ENCOUNTER — Telehealth: Payer: Self-pay

## 2022-04-22 NOTE — Telephone Encounter (Signed)
Talked to patient and advised her ANA is negative, She needs to discuss with PCP possibly stopping Protonix and/or Norvasc to see if these drugs are causing rash. Patient states she has been on both of those medicines for many years. Can discuss further at f/up visit next week.  Continue strict sun protection as discussed.

## 2022-04-22 NOTE — Telephone Encounter (Signed)
-----   Message from Brendolyn Patty, MD sent at 04/22/2022 10:46 AM EDT ----- ANA is negative, She needs to discuss with PCP possibly stopping Protonix and/or Norvasc to see if these drugs are causing rash. Can discuss further at f/up visit next week.  Continue strict sun protection as discussed - please call patient

## 2022-04-28 ENCOUNTER — Other Ambulatory Visit: Payer: Self-pay | Admitting: Gastroenterology

## 2022-04-28 DIAGNOSIS — K743 Primary biliary cirrhosis: Secondary | ICD-10-CM

## 2022-04-29 ENCOUNTER — Ambulatory Visit: Payer: Medicare HMO | Admitting: Dermatology

## 2022-04-29 DIAGNOSIS — R21 Rash and other nonspecific skin eruption: Secondary | ICD-10-CM

## 2022-04-29 MED ORDER — TACROLIMUS 0.1 % EX OINT: TOPICAL_OINTMENT | CUTANEOUS | 1 refills | Status: AC

## 2022-04-29 NOTE — Patient Instructions (Addendum)
Continue clobetasol cream spot treating areas twice a day x 2 weeks, followed by tacrolimus ointment all over affected areas. Avoid clobetasol on the face, groin, underarms.   After 2 weeks, use tacrolimus ointment Monday - Friday twice a day to areas of rash and only use clobetasol cream Saturday and Sunday spot treating areas twice a day.  Topical steroids (such as triamcinolone, fluocinolone, fluocinonide, mometasone, clobetasol, halobetasol, betamethasone, hydrocortisone) can cause thinning and lightening of the skin if they are used for too long in the same area. Your physician has selected the right strength medicine for your problem and area affected on the body. Please use your medication only as directed by your physician to prevent side effects.   Due to recent changes in healthcare laws, you may see results of your pathology and/or laboratory studies on MyChart before the doctors have had a chance to review them. We understand that in some cases there may be results that are confusing or concerning to you. Please understand that not all results are received at the same time and often the doctors may need to interpret multiple results in order to provide you with the best plan of care or course of treatment. Therefore, we ask that you please give [place] 2 business days to thoroughly review all your results before contacting the office for clarification. Should we see a critical lab result, you will be contacted sooner.   If You Need Anything After Your Visit  If you have any questions or concerns for your doctor, please call our main line at [phone] and press option 4 to reach your doctor's medical assistant. If no one answers, please leave a voicemail as directed and we will return your call as soon as possible. Messages left after 4 pm will be answered the following business day.   You may also send [place] a message via [place]. We typically respond to MyChart messages within 1-2 business  days.  For prescription refills, please ask your pharmacy to contact our office. Our fax number is [phone].  If you have an urgent issue when the clinic is closed that cannot wait until the next business day, you can page your doctor at the number below.    Please note that while we do our best to be available for urgent issues outside of office hours, we are not available 24/7.   If you have an urgent issue and are unable to reach [place], you may choose to seek medical care at your doctor's office, retail clinic, urgent care center, or emergency room.  If you have a medical emergency, please immediately call 911 or go to the emergency department.  Pager Numbers  - Dr. Nehemiah Massed: [phone]  - Dr. Laurence Ferrari: [phone]  - Dr. Nicole Kindred: [phone]  In the event of inclement weather, please call our main line at [phone] for an update on the status of any delays or closures.  Dermatology Medication Tips: Please keep the boxes that topical medications come in in order to help keep track of the instructions about where and how to use these. Pharmacies typically print the medication instructions only on the boxes and not directly on the medication tubes.   If your medication is too expensive, please contact our office at [phone] option 4 or send [place] a message through [place].   We are unable to tell what your co-pay for medications will be in advance as this is different depending on your insurance coverage. However, we may be able to find a  substitute medication at lower cost or fill out paperwork to get insurance to cover a needed medication.   If a prior authorization is required to get your medication covered by your insurance company, please allow [place] 1-2 business days to complete this process.  Drug prices often vary depending on where the prescription is filled and some pharmacies may offer cheaper prices.  The website www.goodrx.com contains coupons for medications through  different pharmacies. The prices here do not account for what the cost may be with help from insurance (it may be cheaper with your insurance), but the website can give you the price if you did not use any insurance.  - You can print the associated coupon and take it with your prescription to the pharmacy.  - You may also stop by our office during regular business hours and pick up a GoodRx coupon card.  - If you need your prescription sent electronically to a different pharmacy, notify our office through [facility] or by phone at [phone] option 4.     Si Usted Necesita Algo Despus de Su Visita  Tambin puede enviarnos un mensaje a travs de Pharmacist, community. Por lo general respondemos a los mensajes de MyChart en el transcurso de 1 a 2 das hbiles.  Para renovar recetas, por favor pida a su farmacia que se ponga en contacto con nuestra oficina. Harland Dingwall de fax es [place] [phone].  Si tiene un asunto urgente cuando la clnica est cerrada y que no puede esperar hasta el siguiente da hbil, puede llamar/localizar a su doctor(a) al nmero que aparece a continuacin.   Por favor, tenga en cuenta que aunque hacemos todo lo posible para estar disponibles para asuntos urgentes fuera del horario de [place], no estamos disponibles las 24 horas del da, los 7 das de la [place].   Si tiene un problema urgente y no puede comunicarse con nosotros, puede optar por buscar atencin mdica  en el consultorio de su doctor(a), en una clnica privada, en un centro de atencin urgente o en una sala de emergencias.  Si tiene Engineering geologist, por favor llame inmediatamente al 911 o vaya a la sala de emergencias.  Nmeros de bper  - Dr. Nehemiah Massed: [phone]  - Dra. Moye: [phone]  - Dra. Nicole Kindred: [phone]  En caso de inclemencias del [place], por favor llame a Johnsie Kindred principal al [phone] para una actualizacin sobre el [place] de cualquier retraso o cierre.  Consejos  para la medicacin en dermatologa: Por favor, guarde las cajas en las que vienen los medicamentos de uso tpico para ayudarle a seguir las instrucciones sobre dnde y cmo usarlos. Las farmacias generalmente imprimen las instrucciones del medicamento slo en las cajas y no directamente en los tubos del [place].   Si su medicamento es muy caro, por favor, pngase en contacto con Zigmund Daniel llamando al [phone] y presione la opcin 4 o envenos un mensaje a travs de Pharmacist, community.   No podemos decirle cul ser su copago por los medicamentos por adelantado ya que esto es diferente dependiendo de la cobertura de su seguro. Sin embargo, es posible que podamos encontrar un medicamento sustituto a Electrical engineer un formulario para que el seguro cubra el medicamento que se considera necesario.   Si se requiere una autorizacin previa para que su compaa de seguros [place] su medicamento, por favor permtanos de 1 a 2 das hbiles para completar este proceso.  Los precios de los medicamentos varan con frecuencia dependiendo del Environmental consultant  de dnde se surte la receta y alguna farmacias pueden ofrecer precios ms baratos.  El sitio web www.goodrx.com tiene cupones para medicamentos de Airline pilot. Los precios aqu no tienen en cuenta lo que podra costar con la ayuda del seguro (puede ser ms barato con su seguro), pero el sitio web puede darle el precio si no utiliz Research scientist (physical sciences).  - Puede imprimir el cupn correspondiente y llevarlo con su receta a la farmacia.  - Tambin puede pasar por nuestra oficina durante el horario de atencin regular y Charity fundraiser una tarjeta de cupones de GoodRx.  - Si necesita que su receta se enve electrnicamente a una farmacia diferente, informe a nuestra oficina a travs de MyChart de [place] o por telfono llamando al [phone] y presione la opcin 4.

## 2022-04-29 NOTE — Progress Notes (Signed)
   Follow-Up Visit   Subjective  Kristine Fischer is a 71 y.o. female who presents for the following: Follow-up.  Patient presents for 2 week follow-up rash, biopsy proven interface dermatitis. ANA lab from 04/14/22 was negative. She is using clobetasol cream. She is still getting new spots, not itchy. Her newest medicine is Furosemide.  The following portions of the chart were reviewed this encounter and updated as appropriate:       Review of Systems:  No other skin or systemic complaints except as noted in HPI or Assessment and Plan.  Objective  Well appearing patient in no apparent distress; mood and affect are within normal limits.  A focused examination was performed including face, arms, legs. Relevant physical exam findings are noted in the Assessment and Plan.  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 ANA negative.   Improving some, but still getting new spots. Rash not itchy.   Pt states no new medications for over a year.  Rash came up in April, but no changes in meds prior to that.  Meds of most suspect are Protonix (proton pump inhibitors) and Norvasc (calcium channel blockers) and furosemide.  Pt says she no longer takes HCTZ and is taking furosemide instead.  Furosemide can cause a photodermatitis and rarely SJS and I would consider stopping this one first (per PCP) since this one is her newest medication.   Patient to wear long sleeves, long pants, hat when outdoors. Recommend sunscreen with zinc, samples given.   Continue clobetasol cream only spot treating areas BID x 2 weeks, then decrease to only weekends BID. Avoid face, groin, axilla.  Start tacrolimus ointment apply to AA rash BID (over clobetasol cream) BID dsp 100g 1Rf.  Topical steroids (such as triamcinolone,  fluocinolone, fluocinonide, mometasone, clobetasol, halobetasol, betamethasone, hydrocortisone) can cause thinning and lightening of the skin if they are used for too long in the same area. Your physician has selected the right strength medicine for your problem and area affected on the body. Please use your medication only as directed by your physician to prevent side effects.    tacrolimus (PROTOPIC) 0.1 % ointment - arms, legs Apply to affected areas rash twice daily as directed.  Related Medications clobetasol cream (TEMOVATE) 0.05 % Apply to affected areas rash once to twice daily until improved. Avoid face, groin, underarms, skin folds.   Return in about 6 weeks (around 06/10/2022) for f/u rash.  IJamesetta Orleans, CMA, am acting as scribe for Brendolyn Patty, MD .  Documentation: I have reviewed the above documentation for accuracy and completeness, and I agree with the above.  Brendolyn Patty MD

## 2022-05-05 ENCOUNTER — Ambulatory Visit
Admission: RE | Admit: 2022-05-05 | Discharge: 2022-05-05 | Disposition: A | Discharge status: Home / Self Care | Payer: Medicare HMO | Source: Ambulatory Visit | Attending: Gastroenterology | Admitting: Gastroenterology

## 2022-05-05 DIAGNOSIS — K743 Primary biliary cirrhosis: Secondary | ICD-10-CM | POA: Diagnosis present

## 2022-06-10 ENCOUNTER — Ambulatory Visit: Payer: Medicare HMO | Admitting: Dermatology

## 2022-06-10 DIAGNOSIS — R21 Rash and other nonspecific skin eruption: Secondary | ICD-10-CM | POA: Diagnosis not present

## 2022-06-10 NOTE — Progress Notes (Signed)
   Follow-Up Visit   Subjective  Kristine Fischer is a 71 y.o. female who presents for the following: Rash.  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 ANA negative. She is using clobetasol cream and tacrolimus ointment once daily. She stopped taking furosemide 3 weeks ago. She still has outbreaks on her arms, but unsure when they first came up. Overall, she is improved. Rash not itchy.   The following portions of the chart were reviewed this encounter and updated as appropriate:       Review of Systems:  No other skin or systemic complaints except as noted in HPI or Assessment and Plan.  Objective  Well appearing patient in no apparent distress; mood and affect are within normal limits.  A focused examination was performed including face, arms, legs, chest. Relevant physical exam findings are noted in the Assessment and Plan.  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 ANA negative.   Improving. Rash not itchy. Stay off furosemide for now.   Continue clobetasol cream spot treating AA QD until improved. Avoid face, groin, axilla. Caution skin atrophy with long-term use.  Continue tacrolimus 0.1% ointment treating entire area QD until rash clear.  Continue to wear long sleeves, long pants, hat when outdoors. Recommend sunscreen with zinc. Samples given.   Topical steroids (such as triamcinolone, fluocinolone, fluocinonide, mometasone, clobetasol, halobetasol, betamethasone, hydrocortisone) can cause thinning and lightening of the skin if they are used for too long in the same area. Your physician has selected the right strength medicine for your problem and area affected on the body. Please use your medication  only as directed by your physician to prevent side effects.    Related Medications clobetasol cream (TEMOVATE) 0.05 % Apply to affected areas rash once to twice daily until improved. Avoid face, groin, underarms, skin folds.  tacrolimus (PROTOPIC) 0.1 % ointment Apply to affected areas rash twice daily as directed.   Return if symptoms worsen or fail to improve.  IJamesetta Orleans, CMA, am acting as scribe for Brendolyn Patty, MD .  Documentation: I have reviewed the above documentation for accuracy and completeness, and I agree with the above.  Brendolyn Patty MD

## 2022-06-10 NOTE — Patient Instructions (Addendum)
Clobetasol Cream - Spot treat areas once daily until improved.  Topical steroids (such as triamcinolone, fluocinolone, fluocinonide, mometasone, clobetasol, halobetasol, betamethasone, hydrocortisone) can cause thinning and lightening of the skin if they are used for too long in the same area. Your physician has selected the right strength medicine for your problem and area affected on the body. Please use your medication only as directed by your physician to prevent side effects.   Tacrolimus ointment - Treat entire areas rash once daily until improved.  Continue to wear long sleeves, long pants, hat when outdoors. Recommend sunscreen with zinc.  Due to recent changes in healthcare laws, you may see results of your pathology and/or laboratory studies on MyChart before the doctors have had a chance to review them. We understand that in some cases there may be results that are confusing or concerning to you. Please understand that not all results are received at the same time and often the doctors may need to interpret multiple results in order to provide you with the best plan of care or course of treatment. Therefore, we ask that you please give [place] 2 business days to thoroughly review all your results before contacting the office for clarification. Should we see a critical lab result, you will be contacted sooner.   If You Need Anything After Your Visit  If you have any questions or concerns for your doctor, please call our main line at [phone] and press option 4 to reach your doctor's medical assistant. If no one answers, please leave a voicemail as directed and we will return your call as soon as possible. Messages left after 4 pm will be answered the following business day.   You may also send [place] a message via [place]. We typically respond to MyChart messages within 1-2 business days.  For prescription refills, please ask your pharmacy to contact our office. Our fax number is  [phone].  If you have an urgent issue when the clinic is closed that cannot wait until the next business day, you can page your doctor at the number below.    Please note that while we do our best to be available for urgent issues outside of office hours, we are not available 24/7.   If you have an urgent issue and are unable to reach [place], you may choose to seek medical care at your doctor's office, retail clinic, urgent care center, or emergency room.  If you have a medical emergency, please immediately call 911 or go to the emergency department.  Pager Numbers  - Dr. Nehemiah Massed: [phone]  - Dr. Laurence Ferrari: [phone]  - Dr. Nicole Kindred: [phone]  In the event of inclement weather, please call our main line at [phone] for an update on the status of any delays or closures.  Dermatology Medication Tips: Please keep the boxes that topical medications come in in order to help keep track of the instructions about where and how to use these. Pharmacies typically print the medication instructions only on the boxes and not directly on the medication tubes.   If your medication is too expensive, please contact our office at [phone] option 4 or send [place] a message through [place].   We are unable to tell what your co-pay for medications will be in advance as this is different depending on your insurance coverage. However, we may be able to find a substitute medication at lower cost or fill out paperwork to get insurance to cover a needed medication.   If a prior  authorization is required to get your medication covered by your insurance company, please allow [place] 1-2 business days to complete this process.  Drug prices often vary depending on where the prescription is filled and some pharmacies may offer cheaper prices.  The website www.goodrx.com contains coupons for medications through different pharmacies. The prices here do not account for what the cost may be with help from  insurance (it may be cheaper with your insurance), but the website can give you the price if you did not use any insurance.  - You can print the associated coupon and take it with your prescription to the pharmacy.  - You may also stop by our office during regular business hours and pick up a GoodRx coupon card.  - If you need your prescription sent electronically to a different pharmacy, notify our office through [facility] or by phone at [phone] option 4.     Si Usted Necesita Algo Despus de Su Visita  Tambin puede enviarnos un mensaje a travs de Pharmacist, community. Por lo general respondemos a los mensajes de MyChart en el transcurso de 1 a 2 das hbiles.  Para renovar recetas, por favor pida a su farmacia que se ponga en contacto con nuestra oficina. Harland Dingwall de fax es [place] [phone].  Si tiene un asunto urgente cuando la clnica est cerrada y que no puede esperar hasta el siguiente da hbil, puede llamar/localizar a su doctor(a) al nmero que aparece a continuacin.   Por favor, tenga en cuenta que aunque hacemos todo lo posible para estar disponibles para asuntos urgentes fuera del horario de [place], no estamos disponibles las 24 horas del da, los 7 das de la [place].   Si tiene un problema urgente y no puede comunicarse con nosotros, puede optar por buscar atencin mdica  en el consultorio de su doctor(a), en una clnica privada, en un centro de atencin urgente o en una sala de emergencias.  Si tiene Engineering geologist, por favor llame inmediatamente al 911 o vaya a la sala de emergencias.  Nmeros de bper  - Dr. Nehemiah Massed: [phone]  - Dra. Moye: [phone]  - Dra. Nicole Kindred: [phone]  En caso de inclemencias del [place], por favor llame a Johnsie Kindred principal al [phone] para una actualizacin sobre el [place] de cualquier retraso o cierre.  Consejos para la medicacin en dermatologa: Por favor, guarde las cajas en las que vienen los  medicamentos de uso tpico para ayudarle a seguir las instrucciones sobre dnde y cmo usarlos. Las farmacias generalmente imprimen las instrucciones del medicamento slo en las cajas y no directamente en los tubos del [place].   Si su medicamento es muy caro, por favor, pngase en contacto con Zigmund Daniel llamando al [phone] y presione la opcin 4 o envenos un mensaje a travs de Pharmacist, community.   No podemos decirle cul ser su copago por los medicamentos por adelantado ya que esto es diferente dependiendo de la cobertura de su seguro. Sin embargo, es posible que podamos encontrar un medicamento sustituto a Electrical engineer un formulario para que el seguro cubra el medicamento que se considera necesario.   Si se requiere una autorizacin previa para que su compaa de seguros [place] su medicamento, por favor permtanos de 1 a 2 das hbiles para completar este proceso.  Los precios de los medicamentos varan con frecuencia dependiendo del Environmental consultant de dnde se surte la receta y alguna farmacias pueden ofrecer precios ms baratos.  El sitio web www.goodrx.com tiene cupones para  medicamentos de diferentes farmacias. Los precios aqu no tienen en cuenta lo que podra costar con la ayuda del seguro (puede ser ms barato con su seguro), pero el sitio web puede darle el precio si no utiliz ningn seguro.  - Puede imprimir el cupn correspondiente y llevarlo con su receta a la farmacia.  - Tambin puede pasar por nuestra oficina durante el horario de atencin regular y recoger una tarjeta de cupones de GoodRx.  - Si necesita que su receta se enve electrnicamente a una farmacia diferente, informe a nuestra oficina a travs de MyChart de [place] o por telfono llamando al [phone] y presione la opcin 4.  

## 2022-06-24 ENCOUNTER — Ambulatory Visit
Admission: RE | Admit: 2022-06-24 | Discharge: 2022-06-24 | Disposition: A | Discharge status: Home / Self Care | Payer: Medicare HMO | Source: Ambulatory Visit | Attending: Family Medicine | Admitting: Family Medicine

## 2022-06-24 DIAGNOSIS — Z1231 Encounter for screening mammogram for malignant neoplasm of breast: Secondary | ICD-10-CM | POA: Insufficient documentation

## 2022-07-06 ENCOUNTER — Ambulatory Visit: Payer: Medicare HMO | Admitting: Dermatology

## 2022-07-27 ENCOUNTER — Ambulatory Visit
Admission: RE | Admit: 2022-07-27 | Discharge: 2022-07-27 | Disposition: A | Discharge status: Home / Self Care | Payer: Medicare HMO | Attending: Gastroenterology | Admitting: Gastroenterology

## 2022-07-27 ENCOUNTER — Encounter
Admission: RE | Disposition: A | Discharge status: Home / Self Care | Payer: Self-pay | Source: Home / Self Care | Attending: Gastroenterology

## 2022-07-27 ENCOUNTER — Ambulatory Visit: Payer: Medicare HMO | Admitting: Certified Registered Nurse Anesthetist

## 2022-07-27 ENCOUNTER — Encounter: Payer: Self-pay | Admitting: *Deleted

## 2022-07-27 DIAGNOSIS — Z79899 Other long term (current) drug therapy: Secondary | ICD-10-CM | POA: Diagnosis not present

## 2022-07-27 DIAGNOSIS — K743 Primary biliary cirrhosis: Secondary | ICD-10-CM | POA: Diagnosis not present

## 2022-07-27 DIAGNOSIS — K640 First degree hemorrhoids: Secondary | ICD-10-CM | POA: Diagnosis not present

## 2022-07-27 DIAGNOSIS — Z86010 Personal history of colonic polyps: Secondary | ICD-10-CM | POA: Diagnosis not present

## 2022-07-27 DIAGNOSIS — K219 Gastro-esophageal reflux disease without esophagitis: Secondary | ICD-10-CM | POA: Diagnosis not present

## 2022-07-27 DIAGNOSIS — I10 Essential (primary) hypertension: Secondary | ICD-10-CM | POA: Diagnosis not present

## 2022-07-27 DIAGNOSIS — Z1211 Encounter for screening for malignant neoplasm of colon: Secondary | ICD-10-CM | POA: Insufficient documentation

## 2022-07-27 DIAGNOSIS — D122 Benign neoplasm of ascending colon: Secondary | ICD-10-CM | POA: Diagnosis not present

## 2022-07-27 DIAGNOSIS — E039 Hypothyroidism, unspecified: Secondary | ICD-10-CM | POA: Insufficient documentation

## 2022-07-27 HISTORY — PX: COLONOSCOPY WITH PROPOFOL: SHX5780

## 2022-07-27 SURGERY — COLONOSCOPY WITH PROPOFOL
Anesthesia: General

## 2022-07-27 MED ORDER — DEXMEDETOMIDINE HCL IN NACL 200 MCG/50ML IV SOLN
INTRAVENOUS | Status: DC | PRN
  Administered 2022-07-27: 4 ug via INTRAVENOUS

## 2022-07-27 MED ORDER — SODIUM CHLORIDE 0.9 % IV SOLN
INTRAVENOUS | Status: DC
  Administered 2022-07-27: 1000 mL via INTRAVENOUS

## 2022-07-27 MED ORDER — DEXMEDETOMIDINE HCL IN NACL 80 MCG/20ML IV SOLN
INTRAVENOUS | Status: AC
  Filled 2022-07-27: qty 20

## 2022-07-27 MED ORDER — PROPOFOL 10 MG/ML IV BOLUS
INTRAVENOUS | Status: DC | PRN
  Administered 2022-07-27: 60 mg via INTRAVENOUS

## 2022-07-27 MED ORDER — LIDOCAINE HCL (CARDIAC) PF 100 MG/5ML IV SOSY
PREFILLED_SYRINGE | INTRAVENOUS | Status: DC | PRN
  Administered 2022-07-27: 50 mg via INTRAVENOUS

## 2022-07-27 MED ORDER — PROPOFOL 500 MG/50ML IV EMUL
INTRAVENOUS | Status: DC | PRN
  Administered 2022-07-27: 150 ug/kg/min via INTRAVENOUS

## 2022-07-27 NOTE — Anesthesia Procedure Notes (Signed)
Procedure Name: MAC Date/Time: 07/27/2022 9:39 AM  Performed by: Tollie Eth, CRNAPre-anesthesia Checklist: Patient identified, Emergency Drugs available, Suction available and Patient being monitored Patient Re-evaluated:Patient Re-evaluated prior to induction Oxygen Delivery Method: Nasal cannula Induction Type: IV induction Placement Confirmation: positive ETCO2

## 2022-07-27 NOTE — Interval H&P Note (Signed)
History and Physical Interval Note:  07/27/2022 9:35 AM  Kristine Fischer  has presented today for surgery, with the diagnosis of HX OF ADENOMATOUS POLYP.  The various methods of treatment have been discussed with the patient and family. After consideration of risks, benefits and other options for treatment, the patient has consented to  Procedure(s): COLONOSCOPY WITH PROPOFOL (N/A) as a surgical intervention.  The patient's history has been reviewed, patient examined, no change in status, stable for surgery.  I have reviewed the patient's chart and labs.  Questions were answered to the patient's satisfaction.     Lesly Rubenstein  Ok to proceed with colonoscopy

## 2022-07-27 NOTE — Transfer of Care (Signed)
Immediate Anesthesia Transfer of Care Note  Patient: Kristine Fischer  Procedure(s) Performed: COLONOSCOPY WITH PROPOFOL  Patient Location: PACU  Anesthesia Type:General  Level of Consciousness: drowsy  Airway & Oxygen Therapy: Patient Spontanous Breathing  Post-op Assessment: Report given to RN and Post -op Vital signs reviewed and stable  Post vital signs: Reviewed and stable  Last Vitals:  Vitals Value Taken Time  BP 89/55 07/27/22 1011  Temp    Pulse 53 07/27/22 1011  Resp 14 07/27/22 1011  SpO2 98 % 07/27/22 1011  Vitals shown include unvalidated device data.  Last Pain:  Vitals:   07/27/22 0922  TempSrc: Temporal  PainSc: 0-No pain         Complications: No notable events documented.

## 2022-07-27 NOTE — Anesthesia Preprocedure Evaluation (Signed)
Anesthesia Evaluation  Patient identified by MRN, date of birth, ID band Patient awake    Reviewed: Allergy & Precautions, NPO status , Patient's Chart, lab work & pertinent test results  Airway Mallampati: III  TM Distance: >3 FB Neck ROM: Full    Dental  (+) Poor Dentition, Missing, Dental Advisory Given   Pulmonary neg pulmonary ROS,    Pulmonary exam normal breath sounds clear to auscultation       Cardiovascular Exercise Tolerance: Good hypertension, Pt. on medications negative cardio ROS Normal cardiovascular exam Rhythm:Regular Rate:Normal     Neuro/Psych negative neurological ROS  negative psych ROS   GI/Hepatic negative GI ROS, Neg liver ROS, GERD  Medicated,  Endo/Other  negative endocrine ROSHypothyroidism   Renal/GU negative Renal ROS  negative genitourinary   Musculoskeletal   Abdominal (+) + obese,   Peds negative pediatric ROS (+)  Hematology negative hematology ROS (+)   Anesthesia Other Findings Past Medical History: No date: GERD (gastroesophageal reflux disease) No date: Hypertension No date: Hypothyroidism No date: Primary biliary cirrhosis (HCC)  Past Surgical History: No date: ABDOMINAL HYSTERECTOMY 07/06/2017: COLONOSCOPY WITH PROPOFOL; N/A     Comment:  Procedure: COLONOSCOPY WITH PROPOFOL;  Surgeon:               Lollie Sails, MD;  Location: [facility] ENDOSCOPY;                Service: Endoscopy;  Laterality: N/A; 07/06/2017: ESOPHAGOGASTRODUODENOSCOPY (EGD) WITH PROPOFOL; N/A     Comment:  Procedure: ESOPHAGOGASTRODUODENOSCOPY (EGD) WITH               PROPOFOL;  Surgeon: Lollie Sails, MD;  Location:               [facility] ENDOSCOPY;  Service: Endoscopy;  Laterality: N/A; 09/30/2020: ESOPHAGOGASTRODUODENOSCOPY (EGD) WITH PROPOFOL; N/A     Comment:  Procedure: ESOPHAGOGASTRODUODENOSCOPY (EGD) WITH               PROPOFOL;  Surgeon: Lesly Rubenstein, MD;  Location:                [facility] ENDOSCOPY;  Service: Endoscopy;  Laterality: N/A; No date: HERNIA REPAIR 06/04/2015: VENTRAL HERNIA REPAIR; N/A     Comment:  Procedure: HERNIA REPAIR VENTRAL ADULT;  Surgeon: Leonie Green, MD;  Location: [facility] ORS;  Service: General;              Laterality: N/A;  BMI    Body Mass Index: 39.24 kg/m      Reproductive/Obstetrics negative OB ROS                             Anesthesia Physical Anesthesia Plan  ASA: 3  Anesthesia Plan: General   Post-op Pain Management:    Induction: Intravenous  PONV Risk Score and Plan: Propofol infusion and TIVA  Airway Management Planned: Natural Airway  Additional Equipment:   Intra-op Plan:   Post-operative Plan:   Informed Consent: I have reviewed the patients History and Physical, chart, labs and discussed the procedure including the risks, benefits and alternatives for the proposed anesthesia with the patient or authorized representative who has indicated his/her understanding and acceptance.     Dental Advisory Given  Plan Discussed with: CRNA and Surgeon  Anesthesia Plan Comments:         Anesthesia Quick Evaluation

## 2022-07-27 NOTE — Anesthesia Postprocedure Evaluation (Signed)
Anesthesia Post Note  Patient: Kristine Fischer  Procedure(s) Performed: COLONOSCOPY WITH PROPOFOL  Patient location during evaluation: PACU Anesthesia Type: General Level of consciousness: awake and oriented Pain management: satisfactory to patient Vital Signs Assessment: post-procedure vital signs reviewed and stable Respiratory status: spontaneous breathing and nonlabored ventilation Cardiovascular status: stable Anesthetic complications: no   No notable events documented.   Last Vitals:  Vitals:   07/27/22 1020 07/27/22 1022  BP: 106/61 120/62  Pulse: (!) 56   Resp: 16 12  Temp:    SpO2: 99% 99%    Last Pain:  Vitals:   07/27/22 1022  TempSrc:   PainSc: 0-No pain                 VAN STAVEREN,Foy Mungia

## 2022-07-27 NOTE — Op Note (Signed)
[facility] Gastroenterology Patient Name: Kristine Fischer Procedure Date: 07/27/2022 9:34 AM MRN: [id number] Account #: [id number] Date of Birth: 1951-06-02 Admit Type: Outpatient Age: 71 Room: [facility] ENDO ROOM 3 Gender: Female Note Status: Finalized Instrument Name: Park Meo [id number] Procedure:             Colonoscopy Indications:           Surveillance: Personal history of adenomatous polyps                         on last colonoscopy 5 years ago Providers:             Andrey Farmer MD, MD Referring MD:          Marguerita Merles, MD (Referring MD) Medicines:             Monitored Anesthesia Care Complications:         No immediate complications. Estimated blood loss:                         Minimal. Procedure:             Pre-Anesthesia Assessment:                        - Prior to the procedure, a History and Physical was                         performed, and patient medications and allergies were                         reviewed. The patient is competent. The risks and                         benefits of the procedure and the sedation options and                         risks were discussed with the patient. All questions                         were answered and informed consent was obtained.                         Patient identification and proposed procedure were                         verified by the physician, the nurse, the                         anesthesiologist, the anesthetist and the technician                         in the endoscopy suite. Mental Status Examination:                         alert and oriented. Airway Examination: normal                         oropharyngeal airway and neck mobility. Respiratory  Examination: clear to auscultation. CV Examination:                         normal. Prophylactic Antibiotics: The patient does not                         require prophylactic antibiotics. Prior                          Anticoagulants: The patient has taken no previous                         anticoagulant or antiplatelet agents. ASA Grade                         Assessment: III - A patient with severe systemic                         disease. After reviewing the risks and benefits, the                         patient was deemed in satisfactory condition to                         undergo the procedure. The anesthesia plan was to use                         monitored anesthesia care (MAC). Immediately prior to                         administration of medications, the patient was                         re-assessed for adequacy to receive sedatives. The                         heart rate, respiratory rate, oxygen saturations,                         blood pressure, adequacy of pulmonary ventilation, and                         response to care were monitored throughout the                         procedure. The physical status of the patient was                         re-assessed after the procedure.                        After obtaining informed consent, the colonoscope was                         passed under direct vision. Throughout the procedure,                         the patient's blood pressure, pulse, and oxygen  saturations were monitored continuously. The                         Colonoscope was introduced through the anus and                         advanced to the the cecum, identified by appendiceal                         orifice and ileocecal valve. The colonoscopy was                         somewhat difficult due to significant looping. The                         patient tolerated the procedure well. The quality of                         the bowel preparation was good except the ascending                         colon was poor. Findings:      The perianal and digital rectal examinations were normal.      A 1 mm polyp was found in the ascending colon. The  polyp was sessile.       The polyp was removed with a jumbo cold forceps. Resection and retrieval       were complete. Estimated blood loss was minimal.      Internal hemorrhoids were found during retroflexion. The hemorrhoids       were Grade I (internal hemorrhoids that do not prolapse).      The exam was otherwise without abnormality on direct and retroflexion       views. Impression:            - One 1 mm polyp in the ascending colon, removed with                         a jumbo cold forceps. Resected and retrieved.                        - Internal hemorrhoids.                        - The examination was otherwise normal on direct and                         retroflexion views. Recommendation:        - Discharge patient to home.                        - Resume previous diet.                        - Continue present medications.                        - Await pathology results.                        - Repeat colonoscopy in 1 year because the bowel  preparation was suboptimal.                        - Return to referring physician as previously                         scheduled. Procedure Code(s):     --- Professional ---                        [phone], Colonoscopy, flexible; with biopsy, single or                         multiple Diagnosis Code(s):     --- Professional ---                        Z86.010, Personal history of colonic polyps                        K63.5, Polyp of colon                        K64.0, First degree hemorrhoids CPT copyright 2019 American [employer]. All rights reserved. The codes documented in this report are preliminary and upon coder review may  be revised to meet current compliance requirements. Andrey Farmer MD, MD 07/27/2022 10:26:14 AM Number of Addenda: 0 Note Initiated On: 07/27/2022 9:34 AM Scope Withdrawal Time: 0 hours 7 minutes 13 seconds  Total Procedure Duration: 0 hours 13 minutes 11 seconds  Estimated  Blood Loss:  Estimated blood loss was minimal.      [facility]

## 2022-07-27 NOTE — H&P (Signed)
Outpatient short stay form Pre-procedure 07/27/2022  Lesly Rubenstein, MD  Primary Physician: Marguerita Merles, MD  Reason for visit:  Surveillance colonoscopy  History of present illness:    71 y/o lady with history of hypertension, PBC with fibrosis, and GERD here for surveillance colonoscopy. Last colonoscopy in 2018 with small TA. No blood thinners. Platelets are 130. No family history of GI malignancies. No significant abdominal surgeries.    Current Facility-Administered Medications:    0.9 %  sodium chloride infusion, , Intravenous, Continuous, Kataleena Holsapple, Hilton Cork, MD  Medications Prior to Admission  Medication Sig Dispense Refill Last Dose   amLODipine (NORVASC) 5 MG tablet Take 5 mg by mouth daily.   07/26/2022   aspirin EC 81 MG tablet Take 81 mg by mouth daily.   Past Week   carvedilol (COREG) 12.5 MG tablet Take 12.5 mg by mouth 2 (two) times daily.   07/26/2022   Cholecalciferol (VITAMIN D3) 5000 UNITS CAPS Take 1 capsule by mouth daily.   07/26/2022   clobetasol cream (TEMOVATE) 0.05 % Apply to affected areas rash once to twice daily until improved. Avoid face, groin, underarms, skin folds. 60 g 1 07/26/2022   Cyanocobalamin (VITAMIN B-12) 5000 MCG TBDP Take 1 tablet by mouth daily.   07/26/2022   doxycycline (MONODOX) 100 MG capsule Take 1 capsule (100 mg total) by mouth 2 (two) times daily. Take with food and drink. 60 capsule 0 14/01/8184   Garlic 6314 MG CAPS Take 1 capsule by mouth daily.   07/26/2022   levothyroxine (SYNTHROID) 175 MCG tablet Take by mouth.   07/27/2022 at 0400   mupirocin ointment (BACTROBAN) 2 % Apply 1 Application topically 2 (two) times daily. To aa of buttock and any open wounds until healed. 22 g 0 07/26/2022   pantoprazole (PROTONIX) 40 MG tablet Take 40 mg by mouth daily.   07/26/2022   potassium chloride (MICRO-K) 10 MEQ CR capsule Take 10 mEq by mouth daily.   Past Week   tacrolimus (PROTOPIC) 0.1 % ointment Apply to affected areas rash twice daily  as directed. 100 g 1 07/26/2022   ursodiol (ACTIGALL) 250 MG tablet Take 250 mg by mouth 2 (two) times daily.   Past Week   vitamin C (ASCORBIC ACID) 500 MG tablet Take 500 mg by mouth daily.   07/26/2022   zinc gluconate 50 MG tablet Take 50 mg by mouth daily.   07/26/2022   albuterol (PROVENTIL HFA;VENTOLIN HFA) 108 (90 Base) MCG/ACT inhaler Inhale 2 puffs into the lungs every 4 (four) hours as needed for wheezing or shortness of breath. 1 Inhaler 0  at prn   furosemide (LASIX) 40 MG tablet Take by mouth.    at prn   hydrochlorothiazide (HYDRODIURIL) 25 MG tablet Take 25 mg by mouth daily. (Patient not taking: Reported on 07/27/2022)   Not Taking   MOUNJARO 5 MG/0.5ML Pen Inject into the skin.   07/20/2022     No Known Allergies   Past Medical History:  Diagnosis Date   GERD (gastroesophageal reflux disease)    Hypertension    Hypothyroidism    Primary biliary cirrhosis ([place])     Review of systems:  Otherwise negative.    Physical Exam  Gen: Alert, oriented. Appears stated age.  HEENT:  PERRLA. Lungs: No respiratory distress CV: RRR Abd: soft, benign, no masses Ext: No edema    Planned procedures: Proceed with colonoscopy. The patient understands the nature of the planned procedure, indications, risks, alternatives and  potential complications including but not limited to bleeding, infection, perforation, damage to internal organs and possible oversedation/side effects from anesthesia. The patient agrees and gives consent to proceed.  Please refer to procedure notes for findings, recommendations and patient disposition/instructions.     Lesly Rubenstein, MD [facility] Gastroenterology

## 2022-07-28 ENCOUNTER — Encounter: Payer: Self-pay | Admitting: Gastroenterology

## 2022-07-28 LAB — SURGICAL PATHOLOGY

## 2023-08-04 ENCOUNTER — Other Ambulatory Visit: Payer: Self-pay | Admitting: Family Medicine

## 2023-08-04 DIAGNOSIS — Z1231 Encounter for screening mammogram for malignant neoplasm of breast: Secondary | ICD-10-CM

## 2023-08-17 ENCOUNTER — Ambulatory Visit
Admission: RE | Admit: 2023-08-17 | Discharge: 2023-08-17 | Disposition: A | Discharge status: Home / Self Care | Payer: Medicare HMO | Source: Ambulatory Visit | Attending: Family Medicine | Admitting: Family Medicine

## 2023-08-17 DIAGNOSIS — Z1231 Encounter for screening mammogram for malignant neoplasm of breast: Secondary | ICD-10-CM | POA: Diagnosis present

## 2023-12-09 ENCOUNTER — Other Ambulatory Visit: Payer: Self-pay | Admitting: Gastroenterology

## 2023-12-09 DIAGNOSIS — R188 Other ascites: Secondary | ICD-10-CM

## 2023-12-15 ENCOUNTER — Ambulatory Visit
Admission: RE | Admit: 2023-12-15 | Discharge: 2023-12-15 | Disposition: A | Discharge status: Home / Self Care | Payer: Medicare HMO | Source: Ambulatory Visit | Attending: Gastroenterology | Admitting: Gastroenterology

## 2023-12-15 DIAGNOSIS — K7460 Unspecified cirrhosis of liver: Secondary | ICD-10-CM | POA: Diagnosis present

## 2023-12-15 DIAGNOSIS — R188 Other ascites: Secondary | ICD-10-CM | POA: Diagnosis present

## 2024-02-10 ENCOUNTER — Encounter: Payer: Self-pay | Admitting: *Deleted

## 2024-02-17 ENCOUNTER — Encounter: Payer: Self-pay | Admitting: *Deleted

## 2024-02-18 ENCOUNTER — Ambulatory Visit: Admitting: Anesthesiology

## 2024-02-18 ENCOUNTER — Encounter
Admission: RE | Disposition: A | Discharge status: Home / Self Care | Payer: Self-pay | Source: Home / Self Care | Attending: Gastroenterology

## 2024-02-18 ENCOUNTER — Ambulatory Visit
Admission: RE | Admit: 2024-02-18 | Discharge: 2024-02-18 | Disposition: A | Discharge status: Home / Self Care | Payer: Medicare HMO | Attending: Gastroenterology | Admitting: Gastroenterology

## 2024-02-18 ENCOUNTER — Encounter: Payer: Self-pay | Admitting: *Deleted

## 2024-02-18 DIAGNOSIS — E039 Hypothyroidism, unspecified: Secondary | ICD-10-CM | POA: Insufficient documentation

## 2024-02-18 DIAGNOSIS — K31819 Angiodysplasia of stomach and duodenum without bleeding: Secondary | ICD-10-CM | POA: Insufficient documentation

## 2024-02-18 DIAGNOSIS — K7460 Unspecified cirrhosis of liver: Secondary | ICD-10-CM | POA: Insufficient documentation

## 2024-02-18 DIAGNOSIS — I10 Essential (primary) hypertension: Secondary | ICD-10-CM | POA: Insufficient documentation

## 2024-02-18 DIAGNOSIS — K640 First degree hemorrhoids: Secondary | ICD-10-CM | POA: Diagnosis not present

## 2024-02-18 DIAGNOSIS — K5140 Inflammatory polyps of colon without complications: Secondary | ICD-10-CM | POA: Diagnosis not present

## 2024-02-18 DIAGNOSIS — K219 Gastro-esophageal reflux disease without esophagitis: Secondary | ICD-10-CM | POA: Insufficient documentation

## 2024-02-18 DIAGNOSIS — Z79899 Other long term (current) drug therapy: Secondary | ICD-10-CM | POA: Diagnosis not present

## 2024-02-18 HISTORY — DX: Other fatigue: R53.83

## 2024-02-18 HISTORY — PX: COLONOSCOPY WITH PROPOFOL: SHX5780

## 2024-02-18 HISTORY — PX: ESOPHAGOGASTRODUODENOSCOPY (EGD) WITH PROPOFOL: SHX5813

## 2024-02-18 HISTORY — PX: POLYPECTOMY: SHX149

## 2024-02-18 SURGERY — COLONOSCOPY WITH PROPOFOL
Anesthesia: General

## 2024-02-18 MED ORDER — PROPOFOL 10 MG/ML IV BOLUS
INTRAVENOUS | Status: DC | PRN
  Administered 2024-02-18: 60 mg via INTRAVENOUS
  Administered 2024-02-18: 20 mg via INTRAVENOUS

## 2024-02-18 MED ORDER — SODIUM CHLORIDE 0.9 % IV SOLN
INTRAVENOUS | Status: DC
  Administered 2024-02-18: 500 mL via INTRAVENOUS

## 2024-02-18 MED ORDER — PROPOFOL 500 MG/50ML IV EMUL
INTRAVENOUS | Status: DC | PRN
  Administered 2024-02-18: 155 ug/kg/min via INTRAVENOUS

## 2024-02-18 MED ORDER — GLYCOPYRROLATE 0.2 MG/ML IJ SOLN
INTRAMUSCULAR | Status: DC | PRN
  Administered 2024-02-18 (×2): .2 mg via INTRAVENOUS

## 2024-02-18 MED ORDER — DEXMEDETOMIDINE HCL IN NACL 200 MCG/50ML IV SOLN
INTRAVENOUS | Status: DC | PRN
  Administered 2024-02-18: 12 ug via INTRAVENOUS

## 2024-02-18 MED ORDER — LIDOCAINE HCL (CARDIAC) PF 100 MG/5ML IV SOSY
PREFILLED_SYRINGE | INTRAVENOUS | Status: DC | PRN
  Administered 2024-02-18: 100 mg via INTRAVENOUS

## 2024-02-18 NOTE — Transfer of Care (Signed)
 Immediate Anesthesia Transfer of Care Note  Patient: NOAH RINIER  Procedure(s) Performed: COLONOSCOPY WITH PROPOFOL  ESOPHAGOGASTRODUODENOSCOPY (EGD) WITH PROPOFOL  POLYPECTOMY, INTESTINE  Patient Location: Endoscopy Unit  Anesthesia Type:General  Level of Consciousness: drowsy and patient cooperative  Airway & Oxygen Therapy: Patient Spontanous Breathing and Patient connected to face mask oxygen  Post-op Assessment: Report given to RN and Post -op Vital signs reviewed and stable  Post vital signs: Reviewed and stable  Last Vitals:  Vitals Value Taken Time  BP 97/59 02/18/24 0925  Temp 36.1 C 02/18/24 0925  Pulse 61 02/18/24 0929  Resp 11 02/18/24 0929  SpO2 98 % 02/18/24 0929  Vitals shown include unfiled device data.  Last Pain:  Vitals:   02/18/24 0925  TempSrc: Temporal  PainSc: Asleep         Complications: No notable events documented.

## 2024-02-18 NOTE — H&P (Signed)
 Outpatient short stay form Pre-procedure 02/18/2024  Kristine Darling, MD  Primary Physician: [name], MD  Reason for visit:  Variceal screening/Surveillance colonoscopy  History of present illness:    73 y/o lady with history of hypertension, PBC with fibrosis, and GERD here for surveillance colonoscopy and variceal screening. Last colonoscopy in 2023 with inadequate prep. No blood thinners. Platelets are 145. No family history of GI malignancies. No significant abdominal surgeries.     Current Facility-Administered Medications:    0.9 %  sodium chloride  infusion, , Intravenous, Continuous, Ebonye Reade, Leanora Prophet, MD, Last Rate: 20 mL/hr at 02/18/24 6045, Continued from Pre-op at 02/18/24 0821  Medications Prior to Admission  Medication Sig Dispense Refill Last Dose/Taking   amLODipine (NORVASC) 5 MG tablet Take 5 mg by mouth daily.   02/18/2024   aspirin  EC 81 MG tablet Take 81 mg by mouth daily.   Past Week   carvedilol  (COREG ) 12.5 MG tablet Take 12.5 mg by mouth 2 (two) times daily.   02/18/2024   Cholecalciferol  (VITAMIN D3) 5000 UNITS CAPS Take 1 capsule by mouth daily.   02/17/2024   clobetasol  cream (TEMOVATE ) 0.05 % Apply to affected areas rash once to twice daily until improved. Avoid face, groin, underarms, skin folds. 60 g 1 02/17/2024   Cyanocobalamin  (VITAMIN B-12) 5000 MCG TBDP Take 1 tablet by mouth daily.   02/17/2024   Garlic 1000 MG CAPS Take 1 capsule by mouth daily.   02/17/2024 Morning   levothyroxine  (SYNTHROID ) 175 MCG tablet Take by mouth.   02/18/2024   mupirocin  ointment (BACTROBAN ) 2 % Apply 1 Application topically 2 (two) times daily. To aa of buttock and any open wounds until healed. 22 g 0 Past Week   pantoprazole  (PROTONIX ) 40 MG tablet Take 40 mg by mouth daily.   02/17/2024 Morning   tacrolimus  (PROTOPIC ) 0.1 % ointment Apply to affected areas rash twice daily as directed. 100 g 1 02/17/2024   vitamin C (ASCORBIC ACID) 500 MG tablet Take 500 mg by mouth daily.    02/17/2024 Morning   zinc gluconate 50 MG tablet Take 50 mg by mouth daily.   02/17/2024 Morning   albuterol  (PROVENTIL  HFA;VENTOLIN  HFA) 108 (90 Base) MCG/ACT inhaler Inhale 2 puffs into the lungs every 4 (four) hours as needed for wheezing or shortness of breath. 1 Inhaler 0    doxycycline  (MONODOX ) 100 MG capsule Take 1 capsule (100 mg total) by mouth 2 (two) times daily. Take with food and drink. (Patient not taking: Reported on 02/18/2024) 60 capsule 0 Completed Course   furosemide  (LASIX ) 40 MG tablet Take by mouth. (Patient not taking: Reported on 02/18/2024)   Not Taking   hydrochlorothiazide  (HYDRODIURIL ) 25 MG tablet Take 25 mg by mouth daily. (Patient not taking: Reported on 07/27/2022)      MOUNJARO 5 MG/0.5ML Pen Inject into the skin. (Patient not taking: Reported on 02/18/2024)   Not Taking   potassium chloride  (MICRO-K ) 10 MEQ CR capsule Take 10 mEq by mouth daily. (Patient not taking: Reported on 02/18/2024)   Not Taking     No Known Allergies   Past Medical History:  Diagnosis Date   Fatigue    GERD (gastroesophageal reflux disease)    Hypertension    Hypothyroidism    Primary biliary cirrhosis (HCC)     Review of systems:  Otherwise negative.    Physical Exam  Gen: Alert, oriented. Appears stated age.  HEENT: PERRLA. Lungs: No respiratory distress CV: RRR Abd: soft, benign,  no masses Ext: No edema    Planned procedures: Proceed with EGD/colonoscopy. The patient understands the nature of the planned procedure, indications, risks, alternatives and potential complications including but not limited to bleeding, infection, perforation, damage to internal organs and possible oversedation/side effects from anesthesia. The patient agrees and gives consent to proceed.  Please refer to procedure notes for findings, recommendations and patient disposition/instructions.     Kristine Darling, MD [facility] Gastroenterology

## 2024-02-18 NOTE — Op Note (Signed)
 [facility] Gastroenterology Patient Name: Kristine Fischer Procedure Date: 02/18/2024 8:38 AM MRN: [id number] Account #: [id number] Date of Birth: [birth year] Admit Type: Outpatient Age: 73 Room: [facility] ENDO ROOM 3 Gender: Female Note Status: Finalized Instrument Name: Charlyn Cooley [id number] Procedure:             Colonoscopy Indications:           Surveillance: History of adenomatous polyps,                         inadequate prep on last exam (<42yr) Providers:             Leida Puna MD, MD Medicines:             Monitored Anesthesia Care Complications:         No immediate complications. Estimated blood loss:                         Minimal. Procedure:             Pre-Anesthesia Assessment:                        - Prior to the procedure, a History and Physical was                         performed, and patient medications and allergies were                         reviewed. The patient is competent. The risks and                         benefits of the procedure and the sedation options and                         risks were discussed with the patient. All questions                         were answered and informed consent was obtained.                         Patient identification and proposed procedure were                         verified by the physician, the nurse, the                         anesthesiologist, the anesthetist and the technician                         in the endoscopy suite. Mental Status Examination:                         alert and oriented. Airway Examination: normal                         oropharyngeal airway and neck mobility. Respiratory                         Examination: clear to auscultation. CV Examination:  normal. Prophylactic Antibiotics: The patient does not                         require prophylactic antibiotics. Prior                         Anticoagulants: The patient has taken no anticoagulant                          or antiplatelet agents. ASA Grade Assessment: III - A                         patient with severe systemic disease. After reviewing                         the risks and benefits, the patient was deemed in                         satisfactory condition to undergo the procedure. The                         anesthesia plan was to use monitored anesthesia care                         (MAC). Immediately prior to administration of                         medications, the patient was re-assessed for adequacy                         to receive sedatives. The heart rate, respiratory                         rate, oxygen saturations, blood pressure, adequacy of                         pulmonary ventilation, and response to care were                         monitored throughout the procedure. The physical                         status of the patient was re-assessed after the                         procedure.                        After obtaining informed consent, the colonoscope was                         passed under direct vision. Throughout the procedure,                         the patient's blood pressure, pulse, and oxygen                         saturations were monitored continuously. The  Colonoscope was introduced through the anus and                         advanced to the the cecum, identified by appendiceal                         orifice and ileocecal valve. The colonoscopy was                         performed without difficulty. The patient tolerated                         the procedure well. The quality of the bowel                         preparation was adequate to identify polyps. The                         ileocecal valve, appendiceal orifice, and rectum were                         photographed. Findings:      The perianal and digital rectal examinations were normal.      A 2 mm polyp was found in the descending colon. The polyp  was sessile.       The polyp was removed with a cold snare. Resection and retrieval were       complete. Estimated blood loss was minimal.      Internal hemorrhoids were found during retroflexion. The hemorrhoids       were Grade I (internal hemorrhoids that do not prolapse).      The exam was otherwise without abnormality on direct and retroflexion       views. Impression:            - One 2 mm polyp in the descending colon, removed with                         a cold snare. Resected and retrieved.                        - Internal hemorrhoids.                        - The examination was otherwise normal on direct and                         retroflexion views. Recommendation:        - Discharge patient to home.                        - Resume previous diet.                        - Continue present medications.                        - Await pathology results.                        - Repeat colonoscopy is not recommended due to current  age (51 years or older) for surveillance.                        - Return to referring physician as previously                         scheduled. Procedure Code(s):     --- Professional ---                        [phone], Colonoscopy, flexible; with removal of                         tumor(s), polyp(s), or other lesion(s) by snare                         technique Diagnosis Code(s):     --- Professional ---                        Z86.010, Personal history of colonic polyps                        D12.4, Benign neoplasm of descending colon                        K64.0, First degree hemorrhoids CPT copyright 2022 American Medical Association. All rights reserved. The codes documented in this report are preliminary and upon coder review may  be revised to meet current compliance requirements. Leida Puna MD, MD 02/18/2024 9:32:55 AM Number of Addenda: 0 Note Initiated On: 02/18/2024 8:38 AM Scope Withdrawal Time: 0 hours 8  minutes 1 second  Total Procedure Duration: 0 hours 11 minutes 45 seconds  Estimated Blood Loss:  Estimated blood loss was minimal.      [facility]

## 2024-02-18 NOTE — Op Note (Signed)
 [facility] Gastroenterology Patient Name: Kristine Fischer Procedure Date: 02/18/2024 8:39 AM MRN: [id number] Account #: [id number] Date of Birth: 03/07/1951 Admit Type: Outpatient Age: 73 Room: [facility] ENDO ROOM 3 Gender: Female Note Status: Finalized Instrument Name: Cristino Donna Endoscope [id number] Procedure:             Upper GI endoscopy Indications:           Cirrhosis rule out esophageal varices Providers:             Leida Puna MD, MD Medicines:             Monitored Anesthesia Care Complications:         No immediate complications. Procedure:             Pre-Anesthesia Assessment:                        - Prior to the procedure, a History and Physical was                         performed, and patient medications and allergies were                         reviewed. The patient is competent. The risks and                         benefits of the procedure and the sedation options and                         risks were discussed with the patient. All questions                         were answered and informed consent was obtained.                         Patient identification and proposed procedure were                         verified by the physician, the nurse, the                         anesthesiologist, the anesthetist and the technician                         in the endoscopy suite. Mental Status Examination:                         alert and oriented. Airway Examination: normal                         oropharyngeal airway and neck mobility. Respiratory                         Examination: clear to auscultation. CV Examination:                         normal. Prophylactic Antibiotics: The patient does not                         require prophylactic antibiotics. Prior  Anticoagulants: The patient has taken no anticoagulant                         or antiplatelet agents. ASA Grade Assessment: III - A                         patient with  severe systemic disease. After reviewing                         the risks and benefits, the patient was deemed in                         satisfactory condition to undergo the procedure. The                         anesthesia plan was to use monitored anesthesia care                         (MAC). Immediately prior to administration of                         medications, the patient was re-assessed for adequacy                         to receive sedatives. The heart rate, respiratory                         rate, oxygen saturations, blood pressure, adequacy of                         pulmonary ventilation, and response to care were                         monitored throughout the procedure. The physical                         status of the patient was re-assessed after the                         procedure.                        After obtaining informed consent, the endoscope was                         passed under direct vision. Throughout the procedure,                         the patient's blood pressure, pulse, and oxygen                         saturations were monitored continuously. The Endoscope                         was introduced through the mouth, and advanced to the                         second part of duodenum. The upper GI endoscopy was  accomplished without difficulty. The patient tolerated                         the procedure well. Findings:      The examined esophagus was normal.      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      The examined duodenum was normal. Impression:            - Normal esophagus.                        - Gastric antral vascular ectasia without bleeding.                        - Normal examined duodenum.                        - No specimens collected. Recommendation:        - Discharge patient to home.                        - Resume previous diet.                         - Continue present medications.                        - Repeat upper endoscopy not recommended unless                         patient has a decompensating event.                        - Return to referring physician as previously                         scheduled. Procedure Code(s):     --- Professional ---                        [phone], Esophagogastroduodenoscopy, flexible,                         transoral; diagnostic, including collection of                         specimen(s) by brushing or washing, when performed                         (separate procedure) Diagnosis Code(s):     --- Professional ---                        U04.540, Angiodysplasia of stomach and duodenum                         without bleeding                        K74.60, Unspecified cirrhosis of liver CPT copyright 2022 American [employer]. All rights reserved. The codes documented in this report are preliminary and upon coder review may  be revised to meet current  compliance requirements. Leida Puna MD, MD 02/18/2024 9:26:25 AM Number of Addenda: 0 Note Initiated On: 02/18/2024 8:39 AM Estimated Blood Loss:  Estimated blood loss: none.      [facility]

## 2024-02-18 NOTE — Anesthesia Postprocedure Evaluation (Signed)
 Anesthesia Post Note  Patient: EIREANN SCHAFFNER  Procedure(s) Performed: COLONOSCOPY WITH PROPOFOL  ESOPHAGOGASTRODUODENOSCOPY (EGD) WITH PROPOFOL  POLYPECTOMY, INTESTINE  Patient location during evaluation: Endoscopy Anesthesia Type: General Level of consciousness: awake and alert Pain management: pain level controlled Vital Signs Assessment: post-procedure vital signs reviewed and stable Respiratory status: spontaneous breathing, nonlabored ventilation, respiratory function stable and patient connected to nasal cannula oxygen Cardiovascular status: blood pressure returned to baseline and stable Postop Assessment: no apparent nausea or vomiting Anesthetic complications: no   No notable events documented.   Last Vitals:  Vitals:   02/18/24 0937 02/18/24 0948  BP: (!) 101/55 114/65  Pulse: 62 61  Resp: 12 18  Temp:    SpO2: 97% 97%    Last Pain:  Vitals:   02/18/24 0948  TempSrc:   PainSc: 0-No pain                 Nancey Awkward

## 2024-02-18 NOTE — Interval H&P Note (Signed)
 History and Physical Interval Note:  02/18/2024 8:55 AM  Kristine Fischer  has presented today for surgery, with the diagnosis of Cirrhosis of liver,hx of colon polyp.  The various methods of treatment have been discussed with the patient and family. After consideration of risks, benefits and other options for treatment, the patient has consented to  Procedure(s): COLONOSCOPY WITH PROPOFOL  (N/A) ESOPHAGOGASTRODUODENOSCOPY (EGD) WITH PROPOFOL  (N/A) as a surgical intervention.  The patient's history has been reviewed, patient examined, no change in status, stable for surgery.  I have reviewed the patient's chart and labs.  Questions were answered to the patient's satisfaction.     Shane Darling  Ok to proceed with EGD/Colonoscopy

## 2024-02-18 NOTE — Anesthesia Preprocedure Evaluation (Signed)
 Anesthesia Evaluation  Patient identified by MRN, date of birth, ID band Patient awake    Reviewed: Allergy & Precautions, NPO status , Patient's Chart, lab work & pertinent test results  History of Anesthesia Complications Negative for: history of anesthetic complications  Airway Mallampati: II  TM Distance: >3 FB Neck ROM: full    Dental no notable dental hx.    Pulmonary shortness of breath   Pulmonary exam normal        Cardiovascular hypertension, On Medications negative cardio ROS Normal cardiovascular exam     Neuro/Psych negative neurological ROS  negative psych ROS   GI/Hepatic ,GERD  ,,(+) Cirrhosis         Endo/Other  Hypothyroidism    Renal/GU negative Renal ROS  negative genitourinary   Musculoskeletal   Abdominal   Peds  Hematology negative hematology ROS (+)   Anesthesia Other Findings Past Medical History: No date: Fatigue No date: GERD (gastroesophageal reflux disease) No date: Hypertension No date: Hypothyroidism No date: Primary biliary cirrhosis (HCC)  Past Surgical History: No date: ABDOMINAL HYSTERECTOMY 07/06/2017: COLONOSCOPY WITH PROPOFOL ; N/A     Comment:  Procedure: COLONOSCOPY WITH PROPOFOL ;  Surgeon:               Deveron Fly, MD;  Location: [facility] ENDOSCOPY;                Service: Endoscopy;  Laterality: N/A; 07/27/2022: COLONOSCOPY WITH PROPOFOL ; N/A     Comment:  Procedure: COLONOSCOPY WITH PROPOFOL ;  Surgeon:               Shane Darling, MD;  Location: [facility] ENDOSCOPY;                Service: Endoscopy;  Laterality: N/A; 07/06/2017: ESOPHAGOGASTRODUODENOSCOPY (EGD) WITH PROPOFOL ; N/A     Comment:  Procedure: ESOPHAGOGASTRODUODENOSCOPY (EGD) WITH               PROPOFOL ;  Surgeon: Deveron Fly, MD;  Location:               [facility] ENDOSCOPY;  Service: Endoscopy;  Laterality: N/A; 09/30/2020: ESOPHAGOGASTRODUODENOSCOPY (EGD) WITH PROPOFOL ; N/A     Comment:   Procedure: ESOPHAGOGASTRODUODENOSCOPY (EGD) WITH               PROPOFOL ;  Surgeon: Shane Darling, MD;  Location:               [facility] ENDOSCOPY;  Service: Endoscopy;  Laterality: N/A; No date: HERNIA REPAIR 06/04/2015: VENTRAL HERNIA REPAIR; N/A     Comment:  Procedure: HERNIA REPAIR VENTRAL ADULT;  Surgeon: Benancio Bracket, MD;  Location: [facility] ORS;  Service: General;              Laterality: N/A;  BMI    Body Mass Index: 38.50 kg/m      Reproductive/Obstetrics negative OB ROS                             Anesthesia Physical Anesthesia Plan  ASA: 3  Anesthesia Plan: General   Post-op Pain Management: Minimal or no pain anticipated   Induction: Intravenous  PONV Risk Score and Plan: 2 and Propofol  infusion and TIVA  Airway Management Planned: Natural Airway and Nasal Cannula  Additional Equipment:   Intra-op Plan:   Post-operative Plan:   Informed Consent: I have reviewed the patients History  and Physical, chart, labs and discussed the procedure including the risks, benefits and alternatives for the proposed anesthesia with the patient or authorized representative who has indicated his/her understanding and acceptance.     Dental Advisory Given  Plan Discussed with: Anesthesiologist, CRNA and Surgeon  Anesthesia Plan Comments: (Patient consented for risks of anesthesia including but not limited to:  - adverse reactions to medications - risk of airway placement if required - damage to eyes, teeth, lips or other oral mucosa - nerve damage due to positioning  - sore throat or hoarseness - Damage to heart, brain, nerves, lungs, other parts of body or loss of life  Patient voiced understanding and assent.)       Anesthesia Quick Evaluation

## 2024-02-21 LAB — SURGICAL PATHOLOGY

## 2024-04-24 ENCOUNTER — Encounter: Payer: Self-pay | Admitting: Ophthalmology

## 2024-04-28 NOTE — Discharge Instructions (Signed)

## 2024-05-02 ENCOUNTER — Encounter: Payer: Self-pay | Admitting: Ophthalmology

## 2024-05-02 NOTE — Anesthesia Preprocedure Evaluation (Signed)
 Anesthesia Evaluation  Patient identified by MRN, date of birth, ID band Patient awake    Reviewed: Allergy & Precautions, NPO status , Patient's Chart, lab work & pertinent test results  History of Anesthesia Complications Negative for: history of anesthetic complications  Airway Mallampati: III  TM Distance: >3 FB Neck ROM: full    Dental no notable dental hx. (+) Poor Dentition, Missing   Pulmonary shortness of breath   Pulmonary exam normal        Cardiovascular hypertension, On Medications Normal cardiovascular exam     Neuro/Psych negative neurological ROS  negative psych ROS   GI/Hepatic ,GERD  ,,(+) Cirrhosis         Endo/Other  Hypothyroidism    Renal/GU negative Renal ROS  negative genitourinary   Musculoskeletal   Abdominal   Peds  Hematology negative hematology ROS (+)   Anesthesia Other Findings Past Medical History: No date: Fatigue No date: GERD (gastroesophageal reflux disease) No date: Hypertension No date: Hypothyroidism No date: Primary biliary cirrhosis (HCC)  Past Surgical History: No date: ABDOMINAL HYSTERECTOMY 07/06/2017: COLONOSCOPY WITH PROPOFOL ; N/A     Comment:  Procedure: COLONOSCOPY WITH PROPOFOL ;  Surgeon:               Gaylyn Gladis PENNER, MD;  Location: [facility] ENDOSCOPY;                Service: Endoscopy;  Laterality: N/A; 07/27/2022: COLONOSCOPY WITH PROPOFOL ; N/A     Comment:  Procedure: COLONOSCOPY WITH PROPOFOL ;  Surgeon:               Maryruth Ole DASEN, MD;  Location: [facility] ENDOSCOPY;                Service: Endoscopy;  Laterality: N/A; 07/06/2017: ESOPHAGOGASTRODUODENOSCOPY (EGD) WITH PROPOFOL ; N/A     Comment:  Procedure: ESOPHAGOGASTRODUODENOSCOPY (EGD) WITH               PROPOFOL ;  Surgeon: Gaylyn Gladis PENNER, MD;  Location:               [facility] ENDOSCOPY;  Service: Endoscopy;  Laterality: N/A; 09/30/2020: ESOPHAGOGASTRODUODENOSCOPY (EGD) WITH PROPOFOL ; N/A      Comment:  Procedure: ESOPHAGOGASTRODUODENOSCOPY (EGD) WITH               PROPOFOL ;  Surgeon: Maryruth Ole DASEN, MD;  Location:               [facility] ENDOSCOPY;  Service: Endoscopy;  Laterality: N/A; No date: HERNIA REPAIR 06/04/2015: VENTRAL HERNIA REPAIR; N/A     Comment:  Procedure: HERNIA REPAIR VENTRAL ADULT;  Surgeon: Larinda Unknown Sharps, MD;  Location: [facility] ORS;  Service: General;              Laterality: N/A;  BMI    Body Mass Index: 38.50 kg/m      Reproductive/Obstetrics negative OB ROS                              Anesthesia Physical Anesthesia Plan  ASA: 3  Anesthesia Plan: MAC   Post-op Pain Management:    Induction: Intravenous  PONV Risk Score and Plan:   Airway Management Planned: Natural Airway and Nasal Cannula  Additional Equipment:   Intra-op Plan:   Post-operative Plan:   Informed Consent: I have reviewed the patients History and Physical, chart, labs and discussed the procedure  including the risks, benefits and alternatives for the proposed anesthesia with the patient or authorized representative who has indicated his/her understanding and acceptance.     Dental Advisory Given  Plan Discussed with: Anesthesiologist, CRNA and Surgeon  Anesthesia Plan Comments: (Patient consented for risks of anesthesia including but not limited to:  - adverse reactions to medications - damage to eyes, teeth, lips or other oral mucosa - nerve damage due to positioning  - sore throat or hoarseness - Damage to heart, brain, nerves, lungs, other parts of body or loss of life  Patient voiced understanding and assent.)        Anesthesia Quick Evaluation

## 2024-05-04 ENCOUNTER — Ambulatory Visit: Payer: Self-pay | Admitting: Anesthesiology

## 2024-05-04 ENCOUNTER — Other Ambulatory Visit: Payer: Self-pay

## 2024-05-04 ENCOUNTER — Encounter: Payer: Self-pay | Admitting: Ophthalmology

## 2024-05-04 ENCOUNTER — Encounter
Admission: RE | Disposition: A | Discharge status: Home / Self Care | Payer: Self-pay | Source: Home / Self Care | Attending: Ophthalmology

## 2024-05-04 ENCOUNTER — Ambulatory Visit
Admission: RE | Admit: 2024-05-04 | Discharge: 2024-05-04 | Disposition: A | Discharge status: Home / Self Care | Attending: Ophthalmology | Admitting: Ophthalmology

## 2024-05-04 DIAGNOSIS — K219 Gastro-esophageal reflux disease without esophagitis: Secondary | ICD-10-CM | POA: Diagnosis not present

## 2024-05-04 DIAGNOSIS — E039 Hypothyroidism, unspecified: Secondary | ICD-10-CM | POA: Diagnosis not present

## 2024-05-04 DIAGNOSIS — I10 Essential (primary) hypertension: Secondary | ICD-10-CM | POA: Insufficient documentation

## 2024-05-04 DIAGNOSIS — Z79899 Other long term (current) drug therapy: Secondary | ICD-10-CM | POA: Insufficient documentation

## 2024-05-04 DIAGNOSIS — K7460 Unspecified cirrhosis of liver: Secondary | ICD-10-CM | POA: Diagnosis not present

## 2024-05-04 DIAGNOSIS — H2511 Age-related nuclear cataract, right eye: Secondary | ICD-10-CM | POA: Diagnosis present

## 2024-05-04 HISTORY — DX: Splenomegaly, not elsewhere classified: R16.1

## 2024-05-04 HISTORY — PX: CATARACT EXTRACTION W/PHACO: SHX586

## 2024-05-04 HISTORY — DX: Other ascites: R18.8

## 2024-05-04 HISTORY — DX: Unspecified cirrhosis of liver: K74.60

## 2024-05-04 HISTORY — DX: Primary biliary cirrhosis: K74.3

## 2024-05-04 SURGERY — PHACOEMULSIFICATION, CATARACT, WITH IOL INSERTION
Anesthesia: Monitor Anesthesia Care | Site: Eye | Laterality: Right

## 2024-05-04 MED ORDER — ARMC OPHTHALMIC DILATING DROPS
1.0000 | OPHTHALMIC | Status: DC | PRN
  Administered 2024-05-04 (×3): 1 via OPHTHALMIC

## 2024-05-04 MED ORDER — TETRACAINE HCL 0.5 % OP SOLN
1.0000 [drp] | OPHTHALMIC | Status: DC | PRN
  Administered 2024-05-04 (×3): 1 [drp] via OPHTHALMIC

## 2024-05-04 MED ORDER — MOXIFLOXACIN HCL 0.5 % OP SOLN
OPHTHALMIC | Status: DC | PRN
  Administered 2024-05-04: .2 mL via OPHTHALMIC

## 2024-05-04 MED ORDER — SIGHTPATH DOSE#1 BSS IO SOLN
INTRAOCULAR | Status: DC | PRN
  Administered 2024-05-04: 15 mL via INTRAOCULAR

## 2024-05-04 MED ORDER — LIDOCAINE HCL (PF) 2 % IJ SOLN
INTRAMUSCULAR | Status: DC | PRN
  Administered 2024-05-04: 4 mL via INTRAOCULAR

## 2024-05-04 MED ORDER — SIGHTPATH DOSE#1 NA HYALUR & NA CHOND-NA HYALUR IO KIT
PACK | INTRAOCULAR | Status: DC | PRN
  Administered 2024-05-04: 1 via OPHTHALMIC

## 2024-05-04 MED ORDER — MIDAZOLAM HCL 2 MG/2ML IJ SOLN
INTRAMUSCULAR | Status: AC
  Filled 2024-05-04: qty 2

## 2024-05-04 MED ORDER — LACTATED RINGERS IV SOLN: INTRAVENOUS | Status: DC

## 2024-05-04 MED ORDER — SIGHTPATH DOSE#1 BSS IO SOLN
INTRAOCULAR | Status: DC | PRN
  Administered 2024-05-04: 84 mL via OPHTHALMIC

## 2024-05-04 MED ORDER — MIDAZOLAM HCL 2 MG/2ML IJ SOLN
INTRAMUSCULAR | Status: DC | PRN
  Administered 2024-05-04 (×2): 1 mg via INTRAVENOUS

## 2024-05-04 MED ORDER — BRIMONIDINE TARTRATE-TIMOLOL 0.2-0.5 % OP SOLN
OPHTHALMIC | Status: DC | PRN
  Administered 2024-05-04: 1 [drp] via OPHTHALMIC

## 2024-05-04 SURGICAL SUPPLY — 12 items
CATARACT SUITE SIGHTPATH (MISCELLANEOUS) ×1 IMPLANT
DISSECTOR HYDRO NUCLEUS 50X22 (MISCELLANEOUS) ×1 IMPLANT
DRSG TEGADERM 2-3/8X2-3/4 SM (GAUZE/BANDAGES/DRESSINGS) ×1 IMPLANT
FEE CATARACT SUITE SIGHTPATH (MISCELLANEOUS) ×1 IMPLANT
GLOVE BIOGEL PI IND STRL 8 (GLOVE) ×1 IMPLANT
GLOVE SURG LX STRL 7.5 STRW (GLOVE) ×1 IMPLANT
GLOVE SURG SYN 6.5 PF PI BL (GLOVE) ×1 IMPLANT
LENS IOL ENVISTA ENVY TRC 20.5 ×1 IMPLANT
LENS IOL ENVY TRC 125 20.5 IMPLANT
NDL FILTER BLUNT 18X1 1/2 (NEEDLE) ×1 IMPLANT
NEEDLE FILTER BLUNT 18X1 1/2 (NEEDLE) ×1 IMPLANT
SYR 3ML LL SCALE MARK (SYRINGE) ×1 IMPLANT

## 2024-05-04 NOTE — Transfer of Care (Signed)
 Immediate Anesthesia Transfer of Care Note  Patient: Kristine Fischer  Procedure(s) Performed: PHACOEMULSIFICATION, CATARACT, WITH IOL INSERTION 6.41 00:48.3 (Right: Eye)  Patient Location: PACU  Anesthesia Type:MAC  Level of Consciousness: awake, alert , and oriented  Airway & Oxygen Therapy: Patient Spontanous Breathing  Post-op Assessment: Report given to RN, Post -op Vital signs reviewed and stable, and Patient moving all extremities  Post vital signs: Reviewed and stable  Last Vitals:  Vitals Value Taken Time  BP    Temp    Pulse    Resp    SpO2      Last Pain:  Vitals:   05/04/24 1017  PainSc: 0-No pain         Complications: No notable events documented.

## 2024-05-04 NOTE — Op Note (Addendum)
 OPERATIVE NOTE  RAYHANA SLIDER [id number] 05/04/2024   PREOPERATIVE DIAGNOSIS: Nuclear sclerotic cataract right eye. H25.11   POSTOPERATIVE DIAGNOSIS: Nuclear sclerotic cataract right eye. H25.11   PROCEDURE:  Phacoemusification with Toric posterior chamber intraocular lens placement of the right eye  Ultrasound time: Procedure(s): PHACOEMULSIFICATION, CATARACT, WITH IOL INSERTION 6.41 00:48.3 (Right)  LENS:   Implant Name Type Inv. Item Serial No. Manufacturer Lot No. LRB No. Used Action  [employer] En[employer] Toric IOL Intraocular Lens  [id number] [facility] [id number] Right 1 Implanted    [id number] Toric intraocular lens with 1.25 diopters of cylindrical power with axis orientation at 032 degrees.  SURGEON:  Feliciano HERO. Enola, MD   ANESTHESIA:  Topical with tetracaine  drops, augmented with 1% preservative-free intracameral lidocaine .   COMPLICATIONS:  None.   DESCRIPTION OF PROCEDURE:  The patient was identified in the holding room and transported to the operating room and placed in the supine position under the operating microscope.  The right eye was identified as the operative eye, which was prepped and draped in the usual sterile ophthalmic fashion.   A 1 millimeter clear-corneal paracentesis was made superotemporally. Preservative-free 1% lidocaine  mixed with 1:1,000 bisulfite-free aqueous solution of epinephrine  was injected into the anterior chamber. The anterior chamber was then filled with Viscoat viscoelastic. A 2.4 millimeter keratome was used to make a clear-corneal incision inferotemporally. A curvilinear capsulorrhexis was made with a cystotome and capsulorrhexis forceps. Balanced salt solution was used to hydrodissect and hydrodelineate the nucleus. Phacoemulsification was then used to remove the lens nucleus and epinucleus. The remaining cortex was then removed using the irrigation and aspiration handpiece. Provisc was then placed into the capsular bag to distend it for lens placement. The  Verion digital marker was used to align the implant at the intended axis.  A +20.50 D [id number] Toric lens was then injected into the capsular bag.  It was rotated clockwise until the axis marks on the lens were approximately 15 degrees in the counterclockwise direction to the intended alignment.  The viscoelastic was aspirated from the eye using the irrigation aspiration handpiece.  Then, a blunt chopper through the sideport incision was used to rotate the lens in a clockwise direction until the axis markings of the intraocular lens were lined up with the Verion alignment.  Balanced salt solution was then used to hydrate the wounds.   The anterior chamber was inflated to a physiologic pressure with balanced salt solution.  No wound leaks were noted. Moxifloxacin  was injected intracamerally.  Timolol  and Brimonidine  drops were applied to the eye.  The patient was taken to the recovery room in stable condition without complications of anesthesia or surgery.  Feliciano Hugger Anderson 05/04/2024, 11:59 AM

## 2024-05-04 NOTE — H&P (Signed)
 [facility]   Primary Care Physician:  Buren Rock HERO, MD Ophthalmologist: Dr. Feliciano Ober  Pre-Procedure History & Physical: HPI:  Kristine Fischer is a 73 y.o. female here for cataract surgery.   Past Medical History:  Diagnosis Date   Cirrhosis of liver with ascites (HCC)    Fatigue    GERD (gastroesophageal reflux disease)    Hepatic cirrhosis due to primary biliary cholangitis (HCC)    Hypertension    Hypothyroidism    Primary biliary cirrhosis (HCC)    Primary biliary cirrhosis (HCC)    Splenomegaly     Past Surgical History:  Procedure Laterality Date   ABDOMINAL HYSTERECTOMY     COLONOSCOPY WITH PROPOFOL  N/A 07/06/2017   Procedure: COLONOSCOPY WITH PROPOFOL ;  Surgeon: Gaylyn Gladis PENNER, MD;  Location: [facility] ENDOSCOPY;  Service: Endoscopy;  Laterality: N/A;   COLONOSCOPY WITH PROPOFOL  N/A 07/27/2022   Procedure: COLONOSCOPY WITH PROPOFOL ;  Surgeon: Maryruth Ole DASEN, MD;  Location: [facility] ENDOSCOPY;  Service: Endoscopy;  Laterality: N/A;   COLONOSCOPY WITH PROPOFOL  N/A 02/18/2024   Procedure: COLONOSCOPY WITH PROPOFOL ;  Surgeon: Maryruth Ole DASEN, MD;  Location: [facility] ENDOSCOPY;  Service: Endoscopy;  Laterality: N/A;   ESOPHAGOGASTRODUODENOSCOPY (EGD) WITH PROPOFOL  N/A 07/06/2017   Procedure: ESOPHAGOGASTRODUODENOSCOPY (EGD) WITH PROPOFOL ;  Surgeon: Gaylyn Gladis PENNER, MD;  Location: [facility] ENDOSCOPY;  Service: Endoscopy;  Laterality: N/A;   ESOPHAGOGASTRODUODENOSCOPY (EGD) WITH PROPOFOL  N/A 09/30/2020   Procedure: ESOPHAGOGASTRODUODENOSCOPY (EGD) WITH PROPOFOL ;  Surgeon: Maryruth Ole DASEN, MD;  Location: [facility] ENDOSCOPY;  Service: Endoscopy;  Laterality: N/A;   ESOPHAGOGASTRODUODENOSCOPY (EGD) WITH PROPOFOL  N/A 02/18/2024   Procedure: ESOPHAGOGASTRODUODENOSCOPY (EGD) WITH PROPOFOL ;  Surgeon: Maryruth Ole DASEN, MD;  Location: [facility] ENDOSCOPY;  Service: Endoscopy;  Laterality: N/A;   HERNIA REPAIR     POLYPECTOMY  02/18/2024   Procedure: POLYPECTOMY, INTESTINE;  Surgeon:  Maryruth Ole DASEN, MD;  Location: [facility] ENDOSCOPY;  Service: Endoscopy;;   VENTRAL HERNIA REPAIR N/A 06/04/2015   Procedure: HERNIA REPAIR VENTRAL ADULT;  Surgeon: Larinda Unknown Sharps, MD;  Location: [facility] ORS;  Service: General;  Laterality: N/A;    Prior to Admission medications   Medication Sig Start Date End Date Taking? Authorizing Provider  amLODipine (NORVASC) 5 MG tablet Take 5 mg by mouth daily.   Yes [provider]  aspirin  EC 81 MG tablet Take 81 mg by mouth daily. 04/02/15  Yes [provider]  carvedilol  (COREG ) 12.5 MG tablet Take 12.5 mg by mouth 2 (two) times daily.   Yes [provider]  Cholecalciferol  (VITAMIN D3) 5000 UNITS CAPS Take 1 capsule by mouth daily.   Yes [provider]  Cyanocobalamin  (VITAMIN B-12) 5000 MCG TBDP Take 1 tablet by mouth daily.   Yes [provider]  Garlic 1000 MG CAPS Take 1 capsule by mouth daily.   Yes [provider]  levothyroxine  (SYNTHROID ) 175 MCG tablet Take by mouth.   Yes [provider]  pantoprazole  (PROTONIX ) 40 MG tablet Take 40 mg by mouth daily.   Yes [provider]  ursodiol  (ACTIGALL ) 250 MG tablet Take 250 mg by mouth in the morning and at bedtime.   Yes [provider]  vitamin C (ASCORBIC ACID) 500 MG tablet Take 500 mg by mouth daily.   Yes [provider]  zinc gluconate 50 MG tablet Take 50 mg by mouth daily.   Yes [provider]  albuterol  (PROVENTIL  HFA;VENTOLIN  HFA) 108 (90 Base) MCG/ACT inhaler Inhale 2 puffs into the lungs every 4 (four)  hours as needed for wheezing or shortness of breath. Patient not taking: Reported on 04/24/2024 01/17/17   Sudini, Srikar, MD  clobetasol  cream (TEMOVATE ) 0.05 % Apply to affected areas rash once to twice daily until improved. Avoid face, groin, underarms, skin folds. 04/14/22   Jackquline Sawyer, MD  doxycycline  (MONODOX ) 100 MG capsule Take 1 capsule (100 mg total) by mouth 2 (two) times  daily. Take with food and drink. Patient not taking: Reported on 02/18/2024 04/07/22   Jackquline Sawyer, MD  furosemide  (LASIX ) 40 MG tablet Take by mouth. Patient not taking: Reported on 02/18/2024 07/01/20   [provider]  hydrochlorothiazide  (HYDRODIURIL ) 25 MG tablet Take 25 mg by mouth daily. Patient not taking: Reported on 07/27/2022    [provider]  MOUNJARO 5 MG/0.5ML Pen Inject into the skin. Patient not taking: Reported on 02/18/2024 06/02/22   [provider]  mupirocin  ointment (BACTROBAN ) 2 % Apply 1 Application topically 2 (two) times daily. To aa of buttock and any open wounds until healed. 04/07/22   Jackquline Sawyer, MD  potassium chloride  (MICRO-K ) 10 MEQ CR capsule Take 10 mEq by mouth daily. Patient not taking: Reported on 02/18/2024 03/07/22   [provider]  tacrolimus  (PROTOPIC ) 0.1 % ointment Apply to affected areas rash twice daily as directed. 04/29/22   Jackquline Sawyer, MD    Allergies as of 04/13/2024   (No Known Allergies)    Family History  Problem Relation Age of Onset   Diabetes Father    Hypertension Father    CAD Brother    Breast cancer Paternal Aunt    Heart attack Mother     Social History   Socioeconomic History   Marital status: Married    Spouse name: Not on file   Number of children: Not on file   Years of education: Not on file   Highest education level: Not on file  Occupational History   Not on file  Tobacco Use   Smoking status: Never   Smokeless tobacco: Never  Vaping Use   Vaping status: Never Used  Substance and Sexual Activity   Alcohol use: No   Drug use: No   Sexual activity: Not on file  Other Topics Concern   Not on file  Social History Narrative   Not on file   Social Drivers of Health   Financial Resource Strain: Not on file  Food Insecurity: Not on file  Transportation Needs: Not on file  Physical Activity: Not on file  Stress: Not on file  Social Connections: Not on file  Intimate  Partner Violence: Not on file    Review of Systems: See HPI, otherwise negative ROS  Physical Exam: Ht 5' 7 (1.702 m)   Wt 111.1 kg   BMI 38.37 kg/m  General:   Alert, cooperative in NAD Head:  Normocephalic and atraumatic. Respiratory:  Normal work of breathing. Cardiovascular:  RRR  Impression/Plan: Rock LITTIE Budge is here for cataract surgery.  Risks, benefits, limitations, and alternatives regarding cataract surgery have been reviewed with the patient.  Questions have been answered.  All parties agreeable.   Feliciano Bryan Ober, MD  05/04/2024, 9:07 AM

## 2024-05-04 NOTE — Anesthesia Postprocedure Evaluation (Signed)
 Anesthesia Post Note  Patient: JORDYAN HARDIMAN  Procedure(s) Performed: PHACOEMULSIFICATION, CATARACT, WITH IOL INSERTION 6.41 00:48.3 (Right: Eye)  Patient location during evaluation: PACU Anesthesia Type: MAC Level of consciousness: awake and alert Pain management: pain level controlled Vital Signs Assessment: post-procedure vital signs reviewed and stable Respiratory status: spontaneous breathing, nonlabored ventilation, respiratory function stable and patient connected to nasal cannula oxygen Cardiovascular status: stable and blood pressure returned to baseline Postop Assessment: no apparent nausea or vomiting Anesthetic complications: no   No notable events documented.   Last Vitals:  Vitals:   05/04/24 1208 05/04/24 1210  BP:  (!) 145/70  Pulse: (!) 39   Resp: 13   Temp:    SpO2: 100%     Last Pain:  Vitals:   05/04/24 1208  PainSc: 0-No pain                 Lendia LITTIE Mae

## 2024-05-08 ENCOUNTER — Encounter: Payer: Self-pay | Admitting: Ophthalmology

## 2024-05-15 NOTE — Anesthesia Preprocedure Evaluation (Signed)
 Anesthesia Evaluation  Patient identified by MRN, date of birth, ID band Patient awake    Reviewed: Allergy & Precautions, H&P , NPO status , Patient's Chart, lab work & pertinent test results  Airway Mallampati: III  TM Distance: >3 FB Neck ROM: Full    Dental no notable dental hx. (+) Missing, Poor Dentition   Pulmonary neg pulmonary ROS   Pulmonary exam normal breath sounds clear to auscultation       Cardiovascular hypertension, Normal cardiovascular exam Rhythm:Regular Rate:Normal     Neuro/Psych negative neurological ROS  negative psych ROS   GI/Hepatic negative GI ROS, Neg liver ROS,GERD  ,,(+) Hepatitis -  Endo/Other  negative endocrine ROSHypothyroidism    Renal/GU negative Renal ROS  negative genitourinary   Musculoskeletal negative musculoskeletal ROS (+)    Abdominal   Peds negative pediatric ROS (+)  Hematology negative hematology ROS (+)   Anesthesia Other Findings Previous cataract surgery 05-04-24 Dr. 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)  Hepatic cirrhosis due to primary biliary cholangitis (HCC) Splenomegaly      Reproductive/Obstetrics negative OB ROS                              Anesthesia Physical Anesthesia Plan  ASA: 3  Anesthesia Plan: MAC   Post-op Pain Management:    Induction: Intravenous  PONV Risk Score and Plan:   Airway Management Planned: Natural Airway and Nasal Cannula  Additional Equipment:   Intra-op Plan:   Post-operative Plan:   Informed Consent: I have reviewed the patients History and Physical, chart, labs and discussed the procedure including the risks, benefits and alternatives for the proposed anesthesia  with the patient or authorized representative who has indicated his/her understanding and acceptance.     Dental Advisory Given  Plan Discussed with: Anesthesiologist, CRNA and Surgeon  Anesthesia Plan Comments: (Patient consented for risks of anesthesia including but not limited to:  - adverse reactions to medications - damage to eyes, teeth, lips or other oral mucosa - nerve damage due to positioning  - sore throat or hoarseness - Damage to heart, brain, nerves, lungs, other parts of body or loss of life  Patient voiced understanding and assent.)         Anesthesia Quick Evaluation

## 2024-05-16 NOTE — Discharge Instructions (Signed)

## 2024-05-18 ENCOUNTER — Ambulatory Visit: Payer: Self-pay | Admitting: Anesthesiology

## 2024-05-18 ENCOUNTER — Ambulatory Visit
Admission: RE | Admit: 2024-05-18 | Discharge: 2024-05-18 | Disposition: A | Discharge status: Home / Self Care | Attending: Ophthalmology | Admitting: Ophthalmology

## 2024-05-18 ENCOUNTER — Encounter
Admission: RE | Disposition: A | Discharge status: Home / Self Care | Payer: Self-pay | Source: Home / Self Care | Attending: Ophthalmology

## 2024-05-18 ENCOUNTER — Encounter: Payer: Self-pay | Admitting: Ophthalmology

## 2024-05-18 ENCOUNTER — Other Ambulatory Visit: Payer: Self-pay

## 2024-05-18 DIAGNOSIS — E039 Hypothyroidism, unspecified: Secondary | ICD-10-CM | POA: Diagnosis not present

## 2024-05-18 DIAGNOSIS — I10 Essential (primary) hypertension: Secondary | ICD-10-CM | POA: Insufficient documentation

## 2024-05-18 DIAGNOSIS — Z79899 Other long term (current) drug therapy: Secondary | ICD-10-CM | POA: Insufficient documentation

## 2024-05-18 DIAGNOSIS — H2512 Age-related nuclear cataract, left eye: Secondary | ICD-10-CM | POA: Insufficient documentation

## 2024-05-18 DIAGNOSIS — Z9841 Cataract extraction status, right eye: Secondary | ICD-10-CM | POA: Insufficient documentation

## 2024-05-18 DIAGNOSIS — Z7982 Long term (current) use of aspirin: Secondary | ICD-10-CM | POA: Insufficient documentation

## 2024-05-18 DIAGNOSIS — Z79621 Long term (current) use of calcineurin inhibitor: Secondary | ICD-10-CM | POA: Insufficient documentation

## 2024-05-18 DIAGNOSIS — Z961 Presence of intraocular lens: Secondary | ICD-10-CM | POA: Insufficient documentation

## 2024-05-18 DIAGNOSIS — K219 Gastro-esophageal reflux disease without esophagitis: Secondary | ICD-10-CM | POA: Diagnosis not present

## 2024-05-18 HISTORY — PX: CATARACT EXTRACTION W/PHACO: SHX586

## 2024-05-18 SURGERY — PHACOEMULSIFICATION, CATARACT, WITH IOL INSERTION
Anesthesia: Monitor Anesthesia Care | Site: Eye | Laterality: Left

## 2024-05-18 MED ORDER — TETRACAINE HCL 0.5 % OP SOLN
1.0000 [drp] | OPHTHALMIC | Status: DC | PRN
  Administered 2024-05-18 (×3): 1 [drp] via OPHTHALMIC

## 2024-05-18 MED ORDER — MIDAZOLAM HCL 2 MG/2ML IJ SOLN
INTRAMUSCULAR | Status: DC | PRN
  Administered 2024-05-18 (×2): 1 mg via INTRAVENOUS

## 2024-05-18 MED ORDER — MOXIFLOXACIN HCL 0.5 % OP SOLN
OPHTHALMIC | Status: DC | PRN
  Administered 2024-05-18: .2 mL via OPHTHALMIC

## 2024-05-18 MED ORDER — SIGHTPATH DOSE#1 BSS IO SOLN
INTRAOCULAR | Status: DC | PRN
  Administered 2024-05-18: 15 mL via INTRAOCULAR

## 2024-05-18 MED ORDER — LACTATED RINGERS IV SOLN: INTRAVENOUS | Status: DC

## 2024-05-18 MED ORDER — LIDOCAINE HCL (PF) 2 % IJ SOLN
INTRAOCULAR | Status: DC | PRN
  Administered 2024-05-18: 4 mL via INTRAOCULAR

## 2024-05-18 MED ORDER — ARMC OPHTHALMIC DILATING DROPS
OPHTHALMIC | Status: AC
Start: 2024-05-18 — End: 2024-05-18
  Filled 2024-05-18: qty 0.5

## 2024-05-18 MED ORDER — BRIMONIDINE TARTRATE-TIMOLOL 0.2-0.5 % OP SOLN
OPHTHALMIC | Status: DC | PRN
  Administered 2024-05-18: 1 [drp] via OPHTHALMIC

## 2024-05-18 MED ORDER — MIDAZOLAM HCL 2 MG/2ML IJ SOLN
INTRAMUSCULAR | Status: AC
  Filled 2024-05-18: qty 2

## 2024-05-18 MED ORDER — TETRACAINE HCL 0.5 % OP SOLN
OPHTHALMIC | Status: AC
  Filled 2024-05-18: qty 4

## 2024-05-18 MED ORDER — FENTANYL CITRATE (PF) 100 MCG/2ML IJ SOLN
INTRAMUSCULAR | Status: DC | PRN
  Administered 2024-05-18: 50 ug via INTRAVENOUS

## 2024-05-18 MED ORDER — EPINEPHRINE PF 1 MG/ML IJ SOLN
INTRAMUSCULAR | Status: DC | PRN
  Administered 2024-05-18: 85 mL via OPHTHALMIC

## 2024-05-18 MED ORDER — SIGHTPATH DOSE#1 NA HYALUR & NA CHOND-NA HYALUR IO KIT
PACK | INTRAOCULAR | Status: DC | PRN
  Administered 2024-05-18: 1 via OPHTHALMIC

## 2024-05-18 MED ORDER — FENTANYL CITRATE (PF) 100 MCG/2ML IJ SOLN
INTRAMUSCULAR | Status: AC
  Filled 2024-05-18: qty 2

## 2024-05-18 MED ORDER — ARMC OPHTHALMIC DILATING DROPS
1.0000 | OPHTHALMIC | Status: DC | PRN
  Administered 2024-05-18 (×3): 1 via OPHTHALMIC

## 2024-05-18 MED ORDER — MIDAZOLAM HCL 2 MG/2ML IJ SOLN
2.0000 mg | Freq: Once | INTRAMUSCULAR | Status: AC
Start: 2024-05-18 — End: 2024-05-18
  Administered 2024-05-18: 2 mg via INTRAVENOUS

## 2024-05-18 SURGICAL SUPPLY — 11 items
CATARACT SUITE SIGHTPATH (MISCELLANEOUS) ×1 IMPLANT
DISSECTOR HYDRO NUCLEUS 50X22 (MISCELLANEOUS) ×1 IMPLANT
DRSG TEGADERM 2-3/8X2-3/4 SM (GAUZE/BANDAGES/DRESSINGS) ×1 IMPLANT
FEE CATARACT SUITE SIGHTPATH (MISCELLANEOUS) ×1 IMPLANT
GLOVE BIOGEL PI IND STRL 8 (GLOVE) ×1 IMPLANT
GLOVE SURG LX STRL 7.5 STRW (GLOVE) ×1 IMPLANT
GLOVE SURG SYN 6.5 PF PI BL (GLOVE) ×1 IMPLANT
LENS IOL ENVISTA ENVY 20.5 IMPLANT
NDL FILTER BLUNT 18X1 1/2 (NEEDLE) ×1 IMPLANT
NEEDLE FILTER BLUNT 18X1 1/2 (NEEDLE) ×1 IMPLANT
SYR 3ML LL SCALE MARK (SYRINGE) ×1 IMPLANT

## 2024-05-18 NOTE — Anesthesia Postprocedure Evaluation (Signed)
 Anesthesia Post Note  Patient: Kristine Fischer  Procedure(s) Performed: PHACOEMULSIFICATION, CATARACT, WITH IOL INSERTION 5.37 00:47.0 (Left: Eye)  Patient location during evaluation: PACU Anesthesia Type: MAC Level of consciousness: awake and alert Pain management: pain level controlled Vital Signs Assessment: post-procedure vital signs reviewed and stable Respiratory status: spontaneous breathing, nonlabored ventilation, respiratory function stable and patient connected to nasal cannula oxygen Cardiovascular status: stable and blood pressure returned to baseline Postop Assessment: no apparent nausea or vomiting Anesthetic complications: no   No notable events documented.   Last Vitals:  Vitals:   05/18/24 1230 05/18/24 1236  BP: (!) 154/73 (!) 171/69  Pulse: (!) 40 (!) 48  Resp: 10 14  Temp: (!) 36.2 C (!) 36.2 C  SpO2: 97% 96%    Last Pain:  Vitals:   05/18/24 1236  TempSrc:   PainSc: 0-No pain                 Reisa Coppola C Cadee Agro

## 2024-05-18 NOTE — Op Note (Signed)
 OPERATIVE NOTE  Kristine Fischer COST [id number] 05/18/2024   PREOPERATIVE DIAGNOSIS: Nuclear sclerotic cataract left eye. H25.12   POSTOPERATIVE DIAGNOSIS: Nuclear sclerotic cataract left eye. H25.12   PROCEDURE:  Phacoemusification with posterior chamber intraocular lens placement of the left eye  Ultrasound time: Procedure(s): PHACOEMULSIFICATION, CATARACT, WITH IOL INSERTION 5.37 00:47.0 (Left)  LENS:   Implant Name Type Inv. Item Serial No. Manufacturer Lot No. LRB No. Used Action  LENS IOL ENVISTA ENVY 20.5 - [id number]  LENS IOL ENVISTA ENVY 20.5 [id number] [employer] [id number] Left 1 Implanted      SURGEON:  Feliciano HERO. Enola, MD   ANESTHESIA:  Topical with tetracaine  drops, augmented with 1% preservative-free intracameral lidocaine .   COMPLICATIONS:  None.   DESCRIPTION OF PROCEDURE:  The patient was identified in the holding room and transported to the operating room and placed in the supine position under the operating microscope.  The left eye was identified as the operative eye, which was prepped and draped in the usual sterile ophthalmic fashion.   A 1 millimeter clear-corneal paracentesis was made inferotemporally. Preservative-free 1% lidocaine  mixed with 1:1,000 bisulfite-free aqueous solution of epinephrine  was injected into the anterior chamber. The anterior chamber was then filled with Viscoat viscoelastic. A 2.4 millimeter keratome was used to make a clear-corneal incision superotemporally. A curvilinear capsulorrhexis was made with a cystotome and capsulorrhexis forceps. Balanced salt solution was used to hydrodissect and hydrodelineate the nucleus. Phacoemulsification was then used to remove the lens nucleus and epinucleus. The remaining cortex was then removed using the irrigation and aspiration handpiece. Provisc was then placed into the capsular bag to distend it for lens placement. A +20.50 D EN intraocular lens was then injected into the capsular bag. The remaining  viscoelastic was aspirated.   Wounds were hydrated with balanced salt solution.  The anterior chamber was inflated to a physiologic pressure with balanced salt solution.  No wound leaks were noted. Moxifloxacin  was injected intracamerally.  Timolol  and Brimonidine  drops were applied to the eye.  The patient was taken to the recovery room in stable condition without complications of anesthesia or surgery.  Feliciano Hugger Fairchild 05/18/2024, 12:30 PM

## 2024-05-18 NOTE — H&P (Signed)
 [facility]   Primary Care Physician:  Buren Rock HERO, MD Ophthalmologist: Dr. Feliciano Ober  Pre-Procedure History & Physical: HPI:  Kristine Fischer is a 73 y.o. female here for cataract surgery.   Past Medical History:  Diagnosis Date   Cirrhosis of liver with ascites (HCC)    Fatigue    GERD (gastroesophageal reflux disease)    Hepatic cirrhosis due to primary biliary cholangitis (HCC)    Hypertension    Hypothyroidism    Primary biliary cirrhosis (HCC)    Primary biliary cirrhosis (HCC)    Splenomegaly     Past Surgical History:  Procedure Laterality Date   ABDOMINAL HYSTERECTOMY     CATARACT EXTRACTION W/PHACO Right 05/04/2024   Procedure: PHACOEMULSIFICATION, CATARACT, WITH IOL INSERTION 6.41 00:48.3;  Surgeon: Ober Feliciano Hugger, MD;  Location: [facility] ORS;  Service: Ophthalmology;  Laterality: Right;   COLONOSCOPY WITH PROPOFOL  N/A 07/06/2017   Procedure: COLONOSCOPY WITH PROPOFOL ;  Surgeon: Gaylyn Gladis PENNER, MD;  Location: [facility] ENDOSCOPY;  Service: Endoscopy;  Laterality: N/A;   COLONOSCOPY WITH PROPOFOL  N/A 07/27/2022   Procedure: COLONOSCOPY WITH PROPOFOL ;  Surgeon: Maryruth Ole DASEN, MD;  Location: [facility] ENDOSCOPY;  Service: Endoscopy;  Laterality: N/A;   COLONOSCOPY WITH PROPOFOL  N/A 02/18/2024   Procedure: COLONOSCOPY WITH PROPOFOL ;  Surgeon: Maryruth Ole DASEN, MD;  Location: [facility] ENDOSCOPY;  Service: Endoscopy;  Laterality: N/A;   ESOPHAGOGASTRODUODENOSCOPY (EGD) WITH PROPOFOL  N/A 07/06/2017   Procedure: ESOPHAGOGASTRODUODENOSCOPY (EGD) WITH PROPOFOL ;  Surgeon: Gaylyn Gladis PENNER, MD;  Location: [facility] ENDOSCOPY;  Service: Endoscopy;  Laterality: N/A;   ESOPHAGOGASTRODUODENOSCOPY (EGD) WITH PROPOFOL  N/A 09/30/2020   Procedure: ESOPHAGOGASTRODUODENOSCOPY (EGD) WITH PROPOFOL ;  Surgeon: Maryruth Ole DASEN, MD;  Location: [facility] ENDOSCOPY;  Service: Endoscopy;  Laterality: N/A;   ESOPHAGOGASTRODUODENOSCOPY (EGD) WITH PROPOFOL  N/A 02/18/2024   Procedure:  ESOPHAGOGASTRODUODENOSCOPY (EGD) WITH PROPOFOL ;  Surgeon: Maryruth Ole DASEN, MD;  Location: [facility] ENDOSCOPY;  Service: Endoscopy;  Laterality: N/A;   HERNIA REPAIR     POLYPECTOMY  02/18/2024   Procedure: POLYPECTOMY, INTESTINE;  Surgeon: Maryruth Ole DASEN, MD;  Location: [facility] ENDOSCOPY;  Service: Endoscopy;;   VENTRAL HERNIA REPAIR N/A 06/04/2015   Procedure: HERNIA REPAIR VENTRAL ADULT;  Surgeon: Larinda Unknown Sharps, MD;  Location: [facility] ORS;  Service: General;  Laterality: N/A;    Prior to Admission medications   Medication Sig Start Date End Date Taking? Authorizing Provider  albuterol  (PROVENTIL  HFA;VENTOLIN  HFA) 108 (90 Base) MCG/ACT inhaler Inhale 2 puffs into the lungs every 4 (four) hours as needed for wheezing or shortness of breath. Patient not taking: Reported on 04/24/2024 01/17/17   Sudini, Srikar, MD  amLODipine (NORVASC) 5 MG tablet Take 5 mg by mouth daily.    [provider]  aspirin  EC 81 MG tablet Take 81 mg by mouth daily. 04/02/15   [provider]  carvedilol  (COREG ) 12.5 MG tablet Take 12.5 mg by mouth 2 (two) times daily.    [provider]  Cholecalciferol  (VITAMIN D3) 5000 UNITS CAPS Take 1 capsule by mouth daily.    [provider]  clobetasol  cream (TEMOVATE ) 0.05 % Apply to affected areas rash once to twice daily until improved. Avoid face, groin, underarms, skin folds. 04/14/22   Jackquline Sawyer, MD  Cyanocobalamin  (VITAMIN B-12) 5000 MCG TBDP Take 1 tablet by mouth daily.    [provider]  doxycycline  (MONODOX ) 100 MG capsule Take 1 capsule (100 mg total) by mouth 2 (two) times daily. Take with food and drink. Patient not taking: Reported on  02/18/2024 04/07/22   Jackquline Sawyer, MD  furosemide  (LASIX ) 40 MG tablet Take by mouth. Patient not taking: Reported on 02/18/2024 07/01/20   [provider]  Garlic 1000 MG CAPS Take 1 capsule by mouth daily.    [provider]  hydrochlorothiazide  (HYDRODIURIL ) 25 MG  tablet Take 25 mg by mouth daily. Patient not taking: Reported on 07/27/2022    [provider]  levothyroxine  (SYNTHROID ) 175 MCG tablet Take by mouth.    [provider]  MOUNJARO 5 MG/0.5ML Pen Inject into the skin. Patient not taking: Reported on 02/18/2024 06/02/22   [provider]  mupirocin  ointment (BACTROBAN ) 2 % Apply 1 Application topically 2 (two) times daily. To aa of buttock and any open wounds until healed. 04/07/22   Jackquline Sawyer, MD  pantoprazole  (PROTONIX ) 40 MG tablet Take 40 mg by mouth daily.    [provider]  potassium chloride  (MICRO-K ) 10 MEQ CR capsule Take 10 mEq by mouth daily. Patient not taking: Reported on 02/18/2024 03/07/22   [provider]  tacrolimus  (PROTOPIC ) 0.1 % ointment Apply to affected areas rash twice daily as directed. 04/29/22   Jackquline Sawyer, MD  ursodiol  (ACTIGALL ) 250 MG tablet Take 250 mg by mouth in the morning and at bedtime.    [provider]  vitamin C (ASCORBIC ACID) 500 MG tablet Take 500 mg by mouth daily.    [provider]  zinc gluconate 50 MG tablet Take 50 mg by mouth daily.    [provider]    Allergies as of 04/13/2024   (No Known Allergies)    Family History  Problem Relation Age of Onset   Diabetes Father    Hypertension Father    CAD Brother    Breast cancer Paternal Aunt    Heart attack Mother     Social History   Socioeconomic History   Marital status: Married    Spouse name: Not on file   Number of children: Not on file   Years of education: Not on file   Highest education level: Not on file  Occupational History   Not on file  Tobacco Use   Smoking status: Never   Smokeless tobacco: Never  Vaping Use   Vaping status: Never Used  Substance and Sexual Activity   Alcohol use: No   Drug use: No   Sexual activity: Not on file  Other Topics Concern   Not on file  Social History Narrative   Not on file   Social Drivers of Health    Financial Resource Strain: Not on file  Food Insecurity: Not on file  Transportation Needs: Not on file  Physical Activity: Not on file  Stress: Not on file  Social Connections: Not on file  Intimate Partner Violence: Not on file    Review of Systems: See HPI, otherwise negative ROS  Physical Exam: There were no vitals taken for this visit. General:   Alert, cooperative in NAD Head:  Normocephalic and atraumatic. Respiratory:  Normal work of breathing. Cardiovascular:  RRR  Impression/Plan: Rock LITTIE Budge is here for cataract surgery.  Risks, benefits, limitations, and alternatives regarding cataract surgery have been reviewed with the patient.  Questions have been answered.  All parties agreeable.   Feliciano Bryan Ober, MD  05/18/2024, 7:06 AM

## 2024-05-18 NOTE — Transfer of Care (Signed)
 Immediate Anesthesia Transfer of Care Note  Patient: KASHAUNA CELMER  Procedure(s) Performed: PHACOEMULSIFICATION, CATARACT, WITH IOL INSERTION 5.37 00:47.0 (Left: Eye)  Patient Location: PACU  Anesthesia Type: MAC  Level of Consciousness: awake, alert  and patient cooperative  Airway and Oxygen Therapy: Patient Spontanous Breathing and Patient connected to supplemental oxygen  Post-op Assessment: Post-op Vital signs reviewed, Patient's Cardiovascular Status Stable, Respiratory Function Stable, Patent Airway and No signs of Nausea or vomiting  Post-op Vital Signs: Reviewed and stable  Complications: No notable events documented.

## 2024-08-25 ENCOUNTER — Other Ambulatory Visit: Payer: Self-pay | Admitting: Family Medicine

## 2024-08-25 DIAGNOSIS — Z1231 Encounter for screening mammogram for malignant neoplasm of breast: Secondary | ICD-10-CM

## 2024-08-31 ENCOUNTER — Ambulatory Visit
Admission: RE | Admit: 2024-08-31 | Discharge: 2024-08-31 | Disposition: A | Discharge status: Home / Self Care | Source: Ambulatory Visit | Attending: Family Medicine | Admitting: Family Medicine

## 2024-08-31 DIAGNOSIS — Z1231 Encounter for screening mammogram for malignant neoplasm of breast: Secondary | ICD-10-CM | POA: Diagnosis present
